# Patient Record
Sex: Male | Born: 1960 | Race: White | Hispanic: No | Marital: Married | Smoking: Former smoker
Health system: Southern US, Community
[De-identification: ages and names within clinical notes are randomized; demographics above are authoritative.]

## PROBLEM LIST (undated history)

## (undated) DIAGNOSIS — E119 Type 2 diabetes mellitus without complications: Secondary | ICD-10-CM

## (undated) HISTORY — PX: OTHER SURGICAL HISTORY: SHX169

## (undated) HISTORY — DX: Type 2 diabetes mellitus without complications: E11.9

---

## 2009-05-31 ENCOUNTER — Encounter: Admission: RE | Admit: 2009-05-31 | Discharge: 2009-05-31 | Payer: Self-pay | Admitting: Orthopedic Surgery

## 2009-07-04 ENCOUNTER — Encounter: Admission: RE | Admit: 2009-07-04 | Discharge: 2009-08-29 | Payer: Self-pay | Admitting: Orthopedic Surgery

## 2010-07-24 LAB — HM COLONOSCOPY

## 2011-01-16 ENCOUNTER — Ambulatory Visit (INDEPENDENT_AMBULATORY_CARE_PROVIDER_SITE_OTHER): Payer: 59 | Admitting: Emergency Medicine

## 2011-01-16 ENCOUNTER — Encounter: Payer: Self-pay | Admitting: Emergency Medicine

## 2011-01-16 DIAGNOSIS — J069 Acute upper respiratory infection, unspecified: Secondary | ICD-10-CM

## 2011-01-16 DIAGNOSIS — E119 Type 2 diabetes mellitus without complications: Secondary | ICD-10-CM

## 2011-01-16 LAB — CONVERTED CEMR LAB: Blood Glucose, Fingerstick: 136

## 2011-01-19 NOTE — Assessment & Plan Note (Signed)
Summary: SORE THROAT, CONGESTION, COUGH/WSE   Vital Signs:  Patient Profile:   50 Years Old Male CC:      dry cough, runny nose, congestion x 2 weeks Height:     70 inches Weight:      228 pounds O2 Sat:      97 % O2 treatment:    Room Air Temp:     98.6 degrees F oral Pulse rate:   80 / minute Resp:     16 per minute BP sitting:   122 / 76  (left arm) Cuff size:   large  Vitals Entered By: Betti Cruz RN (January 16, 2011 2:46 PM)             Is Patient Diabetic? Yes  CBG Result 136      Updated Prior Medication List: No Medications Current Allergies: No known allergies History of Present Illness Chief Complaint: dry cough, runny nose, congestion x 2 weeks History of Present Illness: 50 Years Old Male complains of onset of cold symptoms fora few weeks.  Asa has been using Mucinex which is helping a little bit. No sore throat + cough No pleuritic pain No wheezing + nasal congestion + post-nasal drainage + sinus pain/pressure No chest congestion No itchy/red eyes No earache No hemoptysis No SOB No chills/sweats No fever No nausea No vomiting No abdominal pain No diarrhea No skin rashes No fatigue No myalgias No headache   REVIEW OF SYSTEMS Constitutional Symptoms      Denies fever, chills, night sweats, weight loss, weight gain, and fatigue.  Eyes       Denies change in vision, eye pain, eye discharge, glasses, contact lenses, and eye surgery. Ear/Nose/Throat/Mouth       Denies hearing loss/aids, change in hearing, ear pain, ear discharge, dizziness, frequent runny nose, frequent nose bleeds, sinus problems, sore throat, hoarseness, and tooth pain or bleeding.      Comments: congestion Respiratory       Complains of dry cough.      Denies productive cough, wheezing, shortness of breath, asthma, bronchitis, and emphysema/COPD.  Cardiovascular       Denies murmurs, chest pain, and tires easily with exhertion.    Gastrointestinal       Denies  stomach pain, nausea/vomiting, diarrhea, constipation, blood in bowel movements, and indigestion. Genitourniary       Denies painful urination, blood or discharge from penis, kidney stones, and loss of urinary control. Neurological       Complains of headaches.      Denies paralysis, seizures, and fainting/blackouts. Musculoskeletal       Denies muscle pain, joint pain, joint stiffness, decreased range of motion, redness, swelling, muscle weakness, and gout.  Skin       Denies bruising, unusual mles/lumps or sores, and hair/skin or nail changes.  Psych       Denies mood changes, temper/anger issues, anxiety/stress, speech problems, depression, and sleep problems. Other Comments: Taken Mucinex OTC. Cough x 2 months, congestion x 2 weeks   Past History:  Past Medical History: Diabetes mellitus, type II  Past Surgical History: rigth shoulder right arm  Family History: none  Social History: Never Smoked Alcohol use-no Drug use-no Smoking Status:  never Drug Use:  no Physical Exam General appearance: well developed, well nourished, no acute distress Ears: normal, no lesions or deformities Nasal: mucosa pink, nonedematous, no septal deviation, turbinates normal Oral/Pharynx: tongue normal, posterior pharynx without erythema or exudate Chest/Lungs: no rales, wheezes, or rhonchi bilateral,  breath sounds equal without effort Heart: regular rate and  rhythm, no murmur MSE: oriented to time, place, and person Assessment New Problems: UPPER RESPIRATORY INFECTION, ACUTE (ICD-465.9) DIABETES MELLITUS, TYPE II (ICD-250.00)   Plan New Medications/Changes: AMOXICILLIN 875 MG TABS (AMOXICILLIN) 1 by mouth two times a day for 7 days  #14 x 0, 01/16/2011, Fidela Salisbury MD  New Orders: New Patient Level III [phone] Pulse Oximetry (single measurment) [94760] Capillary Blood Glucose/CBG [44739] Planning Comments:   1)  Take the prescribed antibiotic as instructed. 2)  Use nasal  saline solution (over the counter) at least 3 times a day. 3)  Use over the counter decongestants like Zyrtec-D every 12 hours as needed to help with congestion. 4)  Can take tylenol every 6 hours or motrin every 8 hours for pain or fever. 5)  Follow up with your primary doctor  if no improvement in 5-7 days, sooner if increasing pain, fever, or new symptoms.    The patient and/or caregiver has been counseled thoroughly with regard to medications prescribed including dosage, schedule, interactions, rationale for use, and possible side effects and they verbalize understanding.  Diagnoses and expected course of recovery discussed and will return if not improved as expected or if the condition worsens. Patient and/or caregiver verbalized understanding.  Prescriptions: AMOXICILLIN 875 MG TABS (AMOXICILLIN) 1 by mouth two times a day for 7 days  #14 x 0   Entered and Authorized by:   Fidela Salisbury MD   Signed by:   Fidela Salisbury MD on 01/16/2011   Method used:   Print then Give to Patient   RxID:   [id number]   Orders Added: 1)  New Patient Level III [25500] 2)  Pulse Oximetry (single measurment) [94760] 3)  Capillary Blood Glucose/CBG [16429]

## 2013-08-24 ENCOUNTER — Ambulatory Visit (INDEPENDENT_AMBULATORY_CARE_PROVIDER_SITE_OTHER): Payer: 59 | Admitting: Family Medicine

## 2013-08-24 ENCOUNTER — Encounter: Payer: Self-pay | Admitting: Family Medicine

## 2013-08-24 VITALS — BP 125/79 | HR 76 | Ht 70.0 in | Wt 196.0 lb

## 2013-08-24 DIAGNOSIS — Z23 Encounter for immunization: Secondary | ICD-10-CM

## 2013-08-24 DIAGNOSIS — Z0000 Encounter for general adult medical examination without abnormal findings: Secondary | ICD-10-CM

## 2013-08-24 DIAGNOSIS — E1129 Type 2 diabetes mellitus with other diabetic kidney complication: Secondary | ICD-10-CM | POA: Insufficient documentation

## 2013-08-24 DIAGNOSIS — E119 Type 2 diabetes mellitus without complications: Secondary | ICD-10-CM

## 2013-08-24 DIAGNOSIS — Z13220 Encounter for screening for lipoid disorders: Secondary | ICD-10-CM

## 2013-08-24 DIAGNOSIS — Z8042 Family history of malignant neoplasm of prostate: Secondary | ICD-10-CM

## 2013-08-24 DIAGNOSIS — G4700 Insomnia, unspecified: Secondary | ICD-10-CM | POA: Insufficient documentation

## 2013-08-24 DIAGNOSIS — E1165 Type 2 diabetes mellitus with hyperglycemia: Secondary | ICD-10-CM | POA: Insufficient documentation

## 2013-08-24 DIAGNOSIS — Z8669 Personal history of other diseases of the nervous system and sense organs: Secondary | ICD-10-CM

## 2013-08-24 MED ORDER — ZOLPIDEM TARTRATE 5 MG PO TABS: 5.0000 mg | ORAL_TABLET | Freq: Every evening | ORAL | Status: DC | PRN

## 2013-08-24 NOTE — Progress Notes (Signed)
CC: Cristian Watkins is a 52 y.o. male is here for Establish Care   Subjective: HPI:  Colonoscopy: Performed in 2011 given five-year clearance awaiting outside records Prostate: Discussed screening risks/beneifts with patient on 08/24/2013 given history of prostate cancer in father obtain PSA   Influenza Vaccine: He will receive today Pneumovax: Indicated due to type 2 diabetes, he's unsure whether or not he got this at his former practice we will await records Td/Tdap: He believes it's been less than 10 years since last booster we will await records for confirmation Zoster: (Start 52 yo)  Over the past 2 weeks have you been bothered by: - Little interest or pleasure in doing things: No - Feeling down depressed or hopeless: No  Patient's only complaint today is trouble sleeping. He reports after 6 hours of sleep he wakes feeling well rested but after getting home from work will feel intense need for napping. He goes to sleep same hour every night. He denies waking from discomfort anxiety or restlessness he has been given a diagnosis of sleep apnea 15 years ago but refuses to wear CPAP.   Works out on a daily basis rare alcohol use, no recreational drug use or tobacco use   Review of Systems - General ROS: negative for - chills, fever, night sweats, weight gain or weight loss Ophthalmic ROS: negative for - decreased vision Psychological ROS: negative for - anxiety or depression ENT ROS: negative for - hearing change, nasal congestion, tinnitus or allergies Hematological and Lymphatic ROS: negative for - bleeding problems, bruising or swollen lymph nodes Breast ROS: negative Respiratory ROS: no cough, shortness of breath, or wheezing Cardiovascular ROS: no chest pain or dyspnea on exertion Gastrointestinal ROS: no abdominal pain, change in bowel habits, or black or bloody stools Genito-Urinary ROS: negative for - genital discharge, genital ulcers, incontinence or abnormal bleeding from  genitals Musculoskeletal ROS: negative for - joint pain or muscle pain Neurological ROS: negative for - headaches or memory loss Dermatological ROS: negative for lumps, mole changes, rash and skin lesion changes  Past Medical History  Diagnosis Date  . Diabetes      Family History  Problem Relation Age of Onset  . Prostate cancer Father      History  Substance Use Topics  . Smoking status: Never Smoker   . Smokeless tobacco: Not on file  . Alcohol Use: Yes     Objective: Filed Vitals:   08/24/13 1116  BP: 125/79  Pulse: 76   General: No Acute Distress HEENT: Atraumatic, normocephalic, conjunctivae normal without scleral icterus.  No nasal discharge, hearing grossly intact, TMs with good landmarks bilaterally with no middle ear abnormalities, posterior pharynx clear without oral lesions. Neck: Supple, trachea midline, no cervical nor supraclavicular adenopathy. Pulmonary: Clear to auscultation bilaterally without wheezing, rhonchi, nor rales. Cardiac: Regular rate and rhythm.  No murmurs, rubs, nor gallops. No peripheral edema.  2+ peripheral pulses bilaterally. Abdomen: Bowel sounds normal.  No masses.  Non-tender without rebound.  Negative Murphy's sign. GU: Penis without lesions.  Bilateral descended non-tender testicles without palpable abnormal masses. Small epidermoid cyst noninflamed right scrotum MSK: Grossly intact, no signs of weakness.  Full strength throughout upper and lower extremities.  Full ROM in upper and lower extremities.  No midline spinal tenderness. Neuro: Gait unremarkable, CN II-XII grossly intact.  C5-C6 Reflex 2/4 Bilaterally, L4 Reflex 2/4 Bilaterally.  Cerebellar function intact. Skin: No rashes. Psych: Alert and oriented to person/place/time.  Thought process normal. No anxiety/depression.  Assessment &  Plan: Knolan was seen today for establish care.  Diagnoses and associated orders for this visit:  Annual physical exam  Family history of  prostate cancer - PSA  Insomnia - zolpidem (AMBIEN) 5 MG tablet; Take 1 tablet (5 mg total) by mouth at bedtime as needed for sleep.  Lipid screening - Lipid panel  Type 2 diabetes mellitus - BASIC METABOLIC PANEL WITH GFR - Hemoglobin A1c  Need for prophylactic vaccination and inoculation against influenza  History of sleep apnea    Healthy lifestyle interventions including but limited to regular exercise, a healthy low fat diet, moderation of salt intake, the dangers of tobacco/alcohol/recreational drug use, nutrition supplementation, and accident avoidance were discussed with the patient and a handout was provided for future reference. Checking A1c for history of type 2 diabetes diet controlled Trial of Ambien to help with longer sleep to see if this helps with insomnia and improving fatigue at the end of the day  Return in about 3 months (around 11/24/2013).

## 2013-08-24 NOTE — Patient Instructions (Addendum)
Dr. Lajoyce Lauber General Advice Following Your Complete Physical Exam  The Benefits of Regular Exercise: Unless you suffer from an uncontrolled cardiovascular condition, studies strongly suggest that regular exercise and physical activity will add to both the quality and length of your life.  The World Health Organization recommends 150 minutes of moderate intensity aerobic activity every week.  This is best split over 3-4 days a week, and can be as simple as a brisk walk for just over 35 minutes "most days of the week".  This type of exercise has been shown to lower LDL-Cholesterol, lower average blood sugars, lower blood pressure, lower cardiovascular disease risk, improve memory, and increase one's overall sense of wellbeing.  The addition of anaerobic (or "strength training") exercises offers additional benefits including but not limited to increased metabolism, prevention of osteoporosis, and improved overall cholesterol levels.  How Can I Strive For A Low-Fat Diet?: Current guidelines recommend that 25-35 percent of your daily energy (food) intake should come from fats.  One might ask how can this be achieved without having to dissect each meal on a daily basis?  Switch to skim or 1% milk instead of whole milk.  Focus on lean meats such as ground [place], fresh fish, baked chicken, and lean cuts of beef as your source of dietary protein.  Consume less than 367m/day of dietary cholesterol.  Limit trans fatty acid consumption primarily by limiting synthetic trans fats such as partially hydrogenated oils (Ex: fried fast foods).  Focus efforts on reducing your intake of "solid" fats (Ex: Butter).  Substitute olive or vegetable oil for solid fats where possible.  Moderation of Salt Intake: Provided you don't carry a diagnosis of congestive heart failure nor renal failure, I recommend a daily allowance of no more than 2300 mg of salt (sodium).  Keeping under this daily goal is associated with a  decreased risk of cardiovascular events, creeping above it can lead to elevated blood pressures and increases your risk of cardiovascular events.  Milligrams (mg) of salt is listed on all nutrition labels, and your daily intake can add up faster than you think.  Most canned and frozen dinners can pack in over half your daily salt allowance in one meal.    Lifestyle Health Risks: Certain lifestyle choices carry specific health risks.  As you may already know, tobacco use has been associated with increasing one's risk of cardiovascular disease, pulmonary disease, numerous cancers, among many other issues.  What you may not know is that there are medications and nicotine replacement strategies that can more than double your chances of successfully quitting.  I would be thrilled to help manage your quitting strategy if you currently use tobacco products.  When it comes to alcohol use, I've yet to find an "ideal" daily allowance.  Provided an individual does not have a medical condition that is exacerbated by alcohol consumption, general guidelines determine "safe drinking" as no more than two standard drinks for a man or no more than one standard drink for a male per day.  However, much debate still exists on whether any amount of alcohol consumption is technically "safe".  My general advice, keep alcohol consumption to a minimum for general health promotion.  If you or others believe that alcohol, tobacco, or recreational drug use is interfering with your life, I would be happy to provide confidential counseling regarding treatment options.  General "Over The Counter" Nutrition Advice: Postmenopausal women should aim for a daily calcium intake of 1200 mg, however a significant  portion of this might already be provided by diets including milk, yogurt, cheese, and other dairy products.  Vitamin D has been shown to help preserve bone density, prevent fatigue, and has even been shown to help reduce falls in the  elderly.  Ensuring a daily intake of 800 Units of Vitamin D is a good place to start to enjoy the above benefits, we can easily check your Vitamin D level to see if you'd potentially benefit from supplementation beyond 800 Units a day.  Folic Acid intake should be of particular concern to women of childbearing age.  Daily consumption of 824-235 mcg of Folic Acid is recommended to minimize the chance of spinal cord defects in a fetus should pregnancy occur.    For many adults, accidents still remain one of the most common culprits when it comes to cause of death.  Some of the simplest but most effective preventitive habits you can adopt include regular seatbelt use, proper helmet use, securing firearms, and regularly testing your smoke and carbon monoxide detectors.  Carlen Fils [address], [place], [place] [address] Phone: [phone]   Self-Exam Of The Dionne Milo men ages 65-35 are the ones who most commonly get cancer of the testicles. About 42 young men die of this disease each year. Testicular cancer does occur in middle-aged or older men but to a lesser extent. This cancer can almost always be cured if it is found before it gets bad and if it is treated. WORDS TO KNOW:  Testicles are the 2 egg-shaped glands that make hormones and sperm in men.  The scrotum is the skin around testicles.  Epididymis is the rope-like part that is behind and above each testis. It collects sperm made by the testis. WHICH MEN ARE AT HIGH RISK FOR CANCER OF THE TESTICLES?  Men between ages 110 to 45.  Men who are Caucasian.  Men who were born with a testicle that had not moved down into the scrotum.  Men whose testicles have gotten smaller because of an infection.  Men whose fathers or brothers have had cancer of the testicles. SYMPTOMS OF TESTICULAR CANCER  A painless swelling in one of your testicles.  A hard lump. Some lumps may be an infection.  A  heavy feeling in your testicles.  An ache in your lower belly (abdomen) or groin. HOW OFTEN SHOULD I CHECK MY TESTICLES? You should check your testicles every month. HOW SHOULD I DO THIS CHECK? It is best to check your testicles right after a warm shower or bath.  Look at your testicles for any swelling. You may need to use a mirror.  Use both your hands to roll each testicle between your thumb and fingers. Feel for any lumps or changes in the size of the testicle. Press firmly. You may find that one testicle is a little bigger than the other. This is normal.  Next, check the epididymis on each testicle. This is the part where most testicular cancers happen. It is normal for the epididymis to feel soft and uneven. When you get used to how your epididymis feels, you will be able to tell if there is any change.  WHAT IF I FIND ANY SWELLINGS OR LUMPS?  Call your doctor. Some lumps may be an infection. If the lump or swelling is a cancer, it can be treated before it gets worse.  Document Released: 02/04/2009 Document Revised: 01/31/2012 Document Reviewed: 02/04/2009 [employer] Patient Information 2014  [employer]

## 2013-08-31 LAB — BASIC METABOLIC PANEL WITH GFR
CO2: 29 mEq/L (ref 19–32)
Chloride: 106 mEq/L (ref 96–112)
Creat: 0.76 mg/dL (ref 0.50–1.35)
Glucose, Bld: 120 mg/dL — ABNORMAL HIGH (ref 70–99)
Sodium: 138 mEq/L (ref 135–145)

## 2013-08-31 LAB — HEMOGLOBIN A1C
Hgb A1c MFr Bld: 6.2 % — ABNORMAL HIGH (ref <5.7)
Mean Plasma Glucose: 131 mg/dL — ABNORMAL HIGH (ref <117)

## 2013-08-31 LAB — LIPID PANEL
LDL Cholesterol: 69 mg/dL (ref 0–99)
Triglycerides: 34 mg/dL (ref <150)

## 2013-12-18 ENCOUNTER — Encounter: Payer: Self-pay | Admitting: Family Medicine

## 2013-12-18 ENCOUNTER — Ambulatory Visit (INDEPENDENT_AMBULATORY_CARE_PROVIDER_SITE_OTHER): Payer: 59 | Admitting: Family Medicine

## 2013-12-18 VITALS — BP 124/77 | HR 76 | Wt 192.0 lb

## 2013-12-18 DIAGNOSIS — M25519 Pain in unspecified shoulder: Secondary | ICD-10-CM

## 2013-12-18 DIAGNOSIS — M25512 Pain in left shoulder: Secondary | ICD-10-CM

## 2013-12-18 MED ORDER — MELOXICAM 15 MG PO TABS: 15.0000 mg | ORAL_TABLET | Freq: Every day | ORAL | Status: DC

## 2013-12-18 NOTE — Progress Notes (Signed)
CC: Cristian Watkins is a 53 y.o. male is here for left arm injury   Subjective: HPI:  Patient complains of left arm/shoulder pain that has been present for one month on a daily basis. Pain came on about 1 day after he fell after tripping over a curb and braced his fall with his left outstretched hand. Pain is described as a moderate pain that is nonradiating. It is slightly getting better however 2 weeks ago after playing racquetball symptoms return to a moderate degree and have been persistent since. No interventions as of yet. Symptoms are worse with rolling over his left shoulder while sleeping, abducting the arm or flexing the arm beyond 60. Nothing else seems to make it better or worse. He denies any swelling redness or warmth in the shoulder or the arm. Denies weakness, motor or sensory disturbances in the left upper extremity. Pain is localized to the anterior shoulder radiating slightly to the proximal bicep. Denies fevers, chills, shortness of breath, chest pain, nor overlying skin changes at the site of pain   Review Of Systems Outlined In HPI  Past Medical History  Diagnosis Date  . Diabetes      Family History  Problem Relation Age of Onset  . Prostate cancer Father      History  Substance Use Topics  . Smoking status: Never Smoker   . Smokeless tobacco: Not on file  . Alcohol Use: Yes     Objective: Filed Vitals:   12/18/13 1355  BP: 124/77  Pulse: 76    General: Alert and Oriented, No Acute Distress HEENT: Pupils equal, round, reactive to light. Conjunctivae clear.  Moist mucous membranes pharynx unremarkable Lungs: Clear to auscultation bilaterally, no wheezing/ronchi/rales.  Comfortable work of breathing. Good air movement. Extremities: No peripheral edema.  Strong peripheral pulses. He has full grip strength and range of motion from the elbows distally in the left upper extremity. Left shoulder exam shows negative speeds, positive Hawkins, positive empty can,  unable to tolerate Neers due to pain, negative crossarm test. Actively he is only able to flex his arm to approximately 60 or abduct arm to 60 without having to stop due to pain. No pain over palpation of a.c. Joint. Subscapularis and infraspinatus testing shows full strength in the left upper extremity. Mental Status: No depression, anxiety, nor agitation. Skin: Warm and dry.  Assessment & Plan: Cristian Watkins was seen today for left arm injury.  Diagnoses and associated orders for this visit:  Left shoulder pain - meloxicam (MOBIC) 15 MG tablet; Take 1 tablet (15 mg total) by mouth daily.     left shoulder pain with suspicion for supraspinatus tear and or tendinitis. I've encouraged him to start a home exercise plan including a light resistance training with an elastic band, handout provided. Start Mobic as needed for pain. If not improving in 2 weeks return for evaluation with other team sports medicine  25 minutes spent face-to-face during visit today of which at least 50% was counseling or coordinating care regarding: 1. Left shoulder pain      Return in about 2 weeks (around 01/01/2014) for Sports medicine referral with Dr. Darene Lamer. if not improving.

## 2014-07-09 ENCOUNTER — Ambulatory Visit (INDEPENDENT_AMBULATORY_CARE_PROVIDER_SITE_OTHER): Payer: 59 | Admitting: Family Medicine

## 2014-07-09 ENCOUNTER — Encounter: Payer: Self-pay | Admitting: Family Medicine

## 2014-07-09 ENCOUNTER — Encounter (INDEPENDENT_AMBULATORY_CARE_PROVIDER_SITE_OTHER): Payer: Self-pay

## 2014-07-09 VITALS — BP 145/90 | HR 82 | Wt 208.0 lb

## 2014-07-09 DIAGNOSIS — M25519 Pain in unspecified shoulder: Secondary | ICD-10-CM

## 2014-07-09 DIAGNOSIS — Z2839 Other underimmunization status: Secondary | ICD-10-CM

## 2014-07-09 DIAGNOSIS — Z23 Encounter for immunization: Secondary | ICD-10-CM

## 2014-07-09 DIAGNOSIS — G4700 Insomnia, unspecified: Secondary | ICD-10-CM

## 2014-07-09 DIAGNOSIS — M25561 Pain in right knee: Secondary | ICD-10-CM

## 2014-07-09 DIAGNOSIS — M25569 Pain in unspecified knee: Secondary | ICD-10-CM

## 2014-07-09 DIAGNOSIS — M25512 Pain in left shoulder: Secondary | ICD-10-CM

## 2014-07-09 DIAGNOSIS — Z283 Underimmunization status: Secondary | ICD-10-CM

## 2014-07-09 MED ORDER — DICLOFENAC SODIUM 50 MG PO TBEC: DELAYED_RELEASE_TABLET | ORAL | Status: DC

## 2014-07-09 MED ORDER — SUVOREXANT 10 MG PO TABS: 1.0000 | ORAL_TABLET | Freq: Every evening | ORAL | Status: DC | PRN

## 2014-07-09 NOTE — Progress Notes (Signed)
CC: Cristian Watkins is a 53 y.o. male is here for right shoulder pain and insominia   Subjective: HPI:  Continues to have left shoulder pain that has been present for the past 8 months now. Occurred within minutes to hours after he fell on an outstretched left hand after tripping over a curb. Pain continues to be described only has pain and soreness, worse with 90 or greater abduction of the arm or when placing the arm behind his back. Not much benefit from meloxicam nor home exercise plan. Denies change in character severity or frequency of pain.  It is moderate and persistent throughout the day.  Complains of right knee pain has been present for the past one to 2 months. Worse when running and descending stairs. No other interventions other than trying meloxicam which did not help much. Denies recent injury, swelling, redness, warmth, catching, locking, nor giving way. Pain is described as near the kneecap.  Complains of continued insomnia described as difficulty staying asleep. He will follow sleep without difficulty at 8:00 every night and will await 4-5 times for reasons he is unsure of. He frequently has non-restorative sleep but denies fatigue or having to sleep or nap during the daytime. Ambien provided him no benefit whatsoever   Review Of Systems Outlined In HPI  Past Medical History  Diagnosis Date  . Diabetes     Past Surgical History  Procedure Laterality Date  . Rotator cuff surgeries  [id number]   Family History  Problem Relation Age of Onset  . Prostate cancer Father     History   Social History  . Marital Status: Married    Spouse Name: N/A    Number of Children: N/A  . Years of Education: N/A   Occupational History  . Not on file.   Social History Main Topics  . Smoking status: Never Smoker   . Smokeless tobacco: Not on file  . Alcohol Use: Yes  . Drug Use: No  . Sexual Activity: Not on file   Other Topics Concern  . Not on file   Social History  Narrative  . No narrative on file     Objective: BP 145/90  Pulse 82  Wt 208 lb (94.348 kg)  General: Alert and Oriented, No Acute Distress HEENT: Pupils equal, round, reactive to light. Conjunctivae clear.  Moist mucous membranes pharynx unremarkable Lungs: Clear to auscultation bilaterally, no wheezing/ronchi/rales.  Comfortable work of breathing. Good air movement. Cardiac: Regular rate and rhythm. Normal S1/S2.  No murmurs, rubs, nor gallops.   Left shoulder exam reveals full range of motion and strength in all planes of motion and with individual rotator cuff testing. No overlying redness warmth or swelling.  Neer's test positive.  Hawkins test positive. Empty can positive. Crossarm test positive. O'Brien's test positive. Apprehension test negative. Speed's test negative. Right knee exam shows full-strength and range of motion. There is no swelling, redness, nor warmth overlying the knee.  No patellar crepitus. Mild patellar apprehension. No pain with palpation of the inferior patellar pole.  No pain or laxity with valgus nor varus stress. Anterior drawer is negative. McMurray's negative. No popliteal space tenderness or palpable mass. No medial or lateral joint line tenderness to palpation. Mental Status: No depression, anxiety, nor agitation. Skin: Warm and dry.  Assessment & Plan: Cristian Watkins was seen today for right shoulder pain and insominia.  Diagnoses and associated orders for this visit:  Insomnia - Suvorexant (BELSOMRA) 10 MG TABS; Take 1 tablet by mouth  at bedtime as needed (insomnia).  Left shoulder pain  Right knee pain - diclofenac (VOLTAREN) 50 MG EC tablet; Take one tablet every 8 hours only as needed for pain, take with small snack.    Insomnia: Uncontrolled, start Belsomra, free 10 dose savings card was provided. If beneficial call for formal prescription. Left shoulder pain: Suspect rotator cuff tendinitis with possible labral tear. Offered corticosteroid  injection which he received today. Discussed that if he does not provide any relief he may need to consider getting an MRI of the shoulder Right knee pain: Suspect patellofemoral syndrome, start diclofenac at home exercise rehabilitation plan on a daily basis which he was provided with today  Return if symptoms worsen or fail to improve.   Subacromial Shoulder Injection Procedure Note  Pre-operative Diagnosis: left shoulder pain  Post-operative Diagnosis: same  Indications: Unexplained monoarthritis and persistent pain  Anesthesia:topical cold spray  Procedure Details   Verbal consent was obtained for the procedure. The shoulder was prepped with alcohol and the skin was anesthetized. A 27 gauge needle was advanced into the subacromial space through posterior approach without difficulty  The space was then injected with 3 ml 1% lidocaine and 2 ml of triamcinolone (KENALOG) 93m/ml. The injection site was cleansed with isopropyl alcohol and a dressing was applied.  Complications:  None; patient tolerated the procedure well.

## 2014-10-25 ENCOUNTER — Other Ambulatory Visit: Payer: Self-pay | Admitting: Family Medicine

## 2014-10-25 ENCOUNTER — Encounter: Payer: Self-pay | Admitting: Family Medicine

## 2014-10-25 ENCOUNTER — Ambulatory Visit (INDEPENDENT_AMBULATORY_CARE_PROVIDER_SITE_OTHER): Payer: 59 | Admitting: Family Medicine

## 2014-10-25 VITALS — BP 127/88 | HR 84 | Temp 98.3°F | Ht 70.0 in | Wt 220.0 lb

## 2014-10-25 DIAGNOSIS — Z23 Encounter for immunization: Secondary | ICD-10-CM

## 2014-10-25 DIAGNOSIS — Z8042 Family history of malignant neoplasm of prostate: Secondary | ICD-10-CM

## 2014-10-25 DIAGNOSIS — E119 Type 2 diabetes mellitus without complications: Secondary | ICD-10-CM

## 2014-10-25 DIAGNOSIS — M25561 Pain in right knee: Secondary | ICD-10-CM

## 2014-10-25 DIAGNOSIS — Z0000 Encounter for general adult medical examination without abnormal findings: Secondary | ICD-10-CM

## 2014-10-25 DIAGNOSIS — M25562 Pain in left knee: Secondary | ICD-10-CM

## 2014-10-25 LAB — CBC WITH DIFFERENTIAL/PLATELET
BASOS PCT: 0 % (ref 0–1)
Basophils Absolute: 0 10*3/uL (ref 0.0–0.1)
EOS ABS: 0.1 10*3/uL (ref 0.0–0.7)
EOS PCT: 2 % (ref 0–5)
HEMATOCRIT: 44.1 % (ref 39.0–52.0)
Hemoglobin: 14.5 g/dL (ref 13.0–17.0)
LYMPHS ABS: 1.4 10*3/uL (ref 0.7–4.0)
Lymphocytes Relative: 30 % (ref 12–46)
MCH: 27.2 pg (ref 26.0–34.0)
MCHC: 32.9 g/dL (ref 30.0–36.0)
MCV: 82.7 fL (ref 78.0–100.0)
MONO ABS: 0.4 10*3/uL (ref 0.1–1.0)
MONOS PCT: 9 % (ref 3–12)
MPV: 10.4 fL (ref 9.4–12.4)
NEUTROS PCT: 59 % (ref 43–77)
Neutro Abs: 2.8 10*3/uL (ref 1.7–7.7)
Platelets: 208 10*3/uL (ref 150–400)
RBC: 5.33 MIL/uL (ref 4.22–5.81)
RDW: 14.3 % (ref 11.5–15.5)
WBC: 4.7 10*3/uL (ref 4.0–10.5)

## 2014-10-25 LAB — COMPREHENSIVE METABOLIC PANEL
ALK PHOS: 48 U/L (ref 39–117)
ALT: 28 U/L (ref 0–53)
AST: 21 U/L (ref 0–37)
Albumin: 4.3 g/dL (ref 3.5–5.2)
BILIRUBIN TOTAL: 0.6 mg/dL (ref 0.2–1.2)
BUN: 11 mg/dL (ref 6–23)
CO2: 27 meq/L (ref 19–32)
CREATININE: 0.79 mg/dL (ref 0.50–1.35)
Calcium: 9.2 mg/dL (ref 8.4–10.5)
Chloride: 106 mEq/L (ref 96–112)
GLUCOSE: 114 mg/dL — AB (ref 70–99)
Potassium: 4.2 mEq/L (ref 3.5–5.3)
Sodium: 141 mEq/L (ref 135–145)
Total Protein: 6.6 g/dL (ref 6.0–8.3)

## 2014-10-25 LAB — CHOLESTEROL, TOTAL: CHOLESTEROL: 153 mg/dL (ref 0–200)

## 2014-10-25 MED ORDER — MELOXICAM 15 MG PO TABS: 15.0000 mg | ORAL_TABLET | Freq: Every day | ORAL | Status: DC

## 2014-10-25 NOTE — Progress Notes (Signed)
CC: Cristian Watkins is a 53 y.o. male is here for Annual Exam   Subjective: HPI:  Colonoscopy: Normal in 2011, repeat 2021 Prostate: Discussed screening risks/beneifts with patient today, he would prefer a PSA.   Influenza Vaccine: will receive today Pneumovax: no current indication Td/Tdap: UTD since this year Zoster: (Start 54 yo)  Presents for complete physical exam with his only complaint being what he describes as fatigue in his knees. It's described as difficulty getting from a kneeling to standing position but he denies any muscular weakness or knee instability. He continues to play recreational racquetball most days of the week without any knee pain or swelling.  Rare alcohol use no tobacco or recreational drug use  Review of Systems - General ROS: negative for - chills, fever, night sweats, weight gain or weight loss Ophthalmic ROS: negative for - decreased vision Psychological ROS: negative for - anxiety or depression ENT ROS: negative for - hearing change, nasal congestion, tinnitus or allergies Hematological and Lymphatic ROS: negative for - bleeding problems, bruising or swollen lymph nodes Breast ROS: negative Respiratory ROS: no cough, shortness of breath, or wheezing Cardiovascular ROS: no chest pain or dyspnea on exertion Gastrointestinal ROS: no abdominal pain, change in bowel habits, or black or bloody stools Genito-Urinary ROS: negative for - genital discharge, genital ulcers, incontinence or abnormal bleeding from genitals Musculoskeletal ROS: negative for - joint pain or muscle pain other than that described above Neurological ROS: negative for - headaches or memory loss Dermatological ROS: negative for lumps, mole changes, rash and skin lesion changes  Past Medical History  Diagnosis Date  . Diabetes     Past Surgical History  Procedure Laterality Date  . Rotator cuff surgeries  [id number]   Family History  Problem Relation Age of Onset  . Prostate  cancer Father     History   Social History  . Marital Status: Married    Spouse Name: N/A    Number of Children: N/A  . Years of Education: N/A   Occupational History  . Not on file.   Social History Main Topics  . Smoking status: Never Smoker   . Smokeless tobacco: Not on file  . Alcohol Use: Yes  . Drug Use: No  . Sexual Activity: Not on file   Other Topics Concern  . Not on file   Social History Narrative     Objective: BP 127/88 mmHg  Pulse 84  Temp(Src) 98.3 F (36.8 C)  Ht 5' 10"  (1.778 m)  Wt 220 lb (99.791 kg)  BMI 31.57 kg/m2  General: No Acute Distress HEENT: Atraumatic, normocephalic, conjunctivae normal without scleral icterus.  No nasal discharge, hearing grossly intact, TMs with good landmarks bilaterally with no middle ear abnormalities, posterior pharynx clear without oral lesions. Neck: Supple, trachea midline, no cervical nor supraclavicular adenopathy. Pulmonary: Clear to auscultation bilaterally without wheezing, rhonchi, nor rales. Cardiac: Regular rate and rhythm.  No murmurs, rubs, nor gallops. No peripheral edema.  2+ peripheral pulses bilaterally. Abdomen: Bowel sounds normal.  No masses.  Non-tender without rebound.  Negative Murphy's sign. GU: Bilateral descended testes without inguinal hernia  MSK: Grossly intact, no signs of weakness.  Full strength throughout upper and lower extremities.  Full ROM in upper and lower extremities.  No midline spinal tenderness. Neuro: Gait unremarkable, CN II-XII grossly intact.  C5-C6 Reflex 2/4 Bilaterally, L4 Reflex 2/4 Bilaterally.  Cerebellar function intact. Skin: No rashes. Psych: Alert and oriented to person/place/time.  Thought process normal. No anxiety/depression.  Assessment & Plan: Cristian Watkins was seen today for annual exam.  Diagnoses and associated orders for this visit:  Need for prophylactic vaccination and inoculation against influenza - Flu Vaccine QUAD 36+ mos PF IM (Fluarix Quad  PF)  Bilateral knee pain  Annual physical exam  Family history of prostate cancer  Type 2 diabetes mellitus without complication  Other Orders - meloxicam (MOBIC) 15 MG tablet; Take 1 tablet (15 mg total) by mouth daily.    Healthy lifestyle interventions including but not limited to regular exercise, a healthy low fat diet, moderation of salt intake, the dangers of tobacco/alcohol/recreational drug use, nutrition supplementation, and accident avoidance were discussed with the patient and a handout was provided for future reference.  A PSA, CMP, A1c, lipid panel was ordered. Our computer system was not functional when these orders were placed and instead they were processed by paper.  Begin meloxicam as needed for knee pain/fatigue  Return if symptoms worsen or fail to improve.

## 2014-10-26 LAB — PSA: PSA: 2.4 ng/mL (ref <=4.00)

## 2014-10-26 LAB — HEMOGLOBIN A1C
HEMOGLOBIN A1C: 6.9 % — AB (ref <5.7)
MEAN PLASMA GLUCOSE: 151 mg/dL — AB (ref <117)

## 2014-11-25 ENCOUNTER — Telehealth: Payer: Self-pay | Admitting: Family Medicine

## 2014-11-25 ENCOUNTER — Ambulatory Visit (INDEPENDENT_AMBULATORY_CARE_PROVIDER_SITE_OTHER): Payer: 59 | Admitting: Family Medicine

## 2014-11-25 ENCOUNTER — Encounter: Payer: Self-pay | Admitting: Family Medicine

## 2014-11-25 VITALS — BP 133/82 | HR 87 | Wt 219.0 lb

## 2014-11-25 DIAGNOSIS — M722 Plantar fascial fibromatosis: Secondary | ICD-10-CM

## 2014-11-25 NOTE — Progress Notes (Signed)
CC: Cristian Watkins is a 54 y.o. male is here for plantar fascitis   Subjective: HPI:  Right heel pain present for the last week, worst first thing in the morning but present with any weightbearing.  Localized to bottom of the heel and non-radiating.  Worse while wearing dress shoes. Symptoms began after he began a new running regimen which is now stopped. Denies any overlying skin changes. No interventions as of yet other than wearing new insoles which doesn't help much, also has tried wearing athletic shoes which helps improve the pain while standing. Denies joint pain elsewhere. Denies any recent trauma. Denies any motor or sensory disturbances in the right lower extremity other than that described above   Review Of Systems Outlined In HPI  Past Medical History  Diagnosis Date  . Diabetes     Past Surgical History  Procedure Laterality Date  . Rotator cuff surgeries  [id number]   Family History  Problem Relation Age of Onset  . Prostate cancer Father     History   Social History  . Marital Status: Married    Spouse Name: N/A    Number of Children: N/A  . Years of Education: N/A   Occupational History  . Not on file.   Social History Main Topics  . Smoking status: Never Smoker   . Smokeless tobacco: Not on file  . Alcohol Use: Yes  . Drug Use: No  . Sexual Activity: Not on file   Other Topics Concern  . Not on file   Social History Narrative     Objective: BP 133/82 mmHg  Pulse 87  Wt 219 lb (99.338 kg)  Vital signs reviewed. General: Alert and Oriented, No Acute Distress HEENT: Pupils equal, round, reactive to light. Conjunctivae clear.  External ears unremarkable.  Moist mucous membranes. Lungs: Clear and comfortable work of breathing, speaking in full sentences without accessory muscle use. Cardiac: Regular rate and rhythm.  Neuro: CN II-XII grossly intact, gait normal. Extremities: No peripheral edema.  Strong peripheral pulses. Right foot exam reveals  no pain at the lateral or medial malleoli, no pain at the base of the fifth metatarsal, no pain at the navicular, no pain with resisted inversion or eversion, pain is reproduced with passive dorsiflexion or pressing on the bottom of the calcaneus Mental Status: No depression, anxiety, nor agitation. Logical though process. Skin: Warm and dry.  Assessment & Plan: Odies was seen today for plantar fascitis.  Diagnoses and associated orders for this visit:  Plantar fasciitis of right foot    Plantar fasciitis: Discussed home rehabilitative exercises and stretches to be performed on a daily basis for the next 3-4 weeks before returning to his running regimen. Offered meloxicam however he politely declined, asked to call me if he reconsiders. Discussed wearing heel cups and avoiding dress shoes, provided with a letter stating that it is medically necessary for him to wear shoes that are not dress shoes.    Return if symptoms worsen or fail to improve.

## 2014-11-25 NOTE — Telephone Encounter (Signed)
Patient states that due to his plantar fascitis he needs work note stating that he can wear tennis shoes instead of dress shoes.  Please let him know when this is ready.  thanks

## 2014-11-25 NOTE — Telephone Encounter (Signed)
Pt has not been seen for plantar fascitis and I didn't see that this was noted anywhere in his chart and I don't see an diagnosis for this. He states he thought he received some exercises about this but all I saw that was addressed was  his knee pain and shoulder pain  and exercises were given for both of these. Offered pt appt and he did accept

## 2015-10-31 ENCOUNTER — Ambulatory Visit (INDEPENDENT_AMBULATORY_CARE_PROVIDER_SITE_OTHER): Payer: 59 | Admitting: Family Medicine

## 2015-10-31 ENCOUNTER — Ambulatory Visit (INDEPENDENT_AMBULATORY_CARE_PROVIDER_SITE_OTHER): Payer: 59

## 2015-10-31 ENCOUNTER — Encounter: Payer: 59 | Admitting: Family Medicine

## 2015-10-31 ENCOUNTER — Encounter: Payer: Self-pay | Admitting: Family Medicine

## 2015-10-31 VITALS — BP 130/85 | HR 75 | Wt 221.0 lb

## 2015-10-31 DIAGNOSIS — E119 Type 2 diabetes mellitus without complications: Secondary | ICD-10-CM

## 2015-10-31 DIAGNOSIS — G8929 Other chronic pain: Secondary | ICD-10-CM

## 2015-10-31 DIAGNOSIS — Z0000 Encounter for general adult medical examination without abnormal findings: Secondary | ICD-10-CM | POA: Diagnosis not present

## 2015-10-31 DIAGNOSIS — M222X2 Patellofemoral disorders, left knee: Secondary | ICD-10-CM

## 2015-10-31 DIAGNOSIS — G4700 Insomnia, unspecified: Secondary | ICD-10-CM

## 2015-10-31 DIAGNOSIS — M170 Bilateral primary osteoarthritis of knee: Secondary | ICD-10-CM | POA: Diagnosis not present

## 2015-10-31 DIAGNOSIS — Z23 Encounter for immunization: Secondary | ICD-10-CM | POA: Diagnosis not present

## 2015-10-31 DIAGNOSIS — M25569 Pain in unspecified knee: Secondary | ICD-10-CM

## 2015-10-31 DIAGNOSIS — Z8042 Family history of malignant neoplasm of prostate: Secondary | ICD-10-CM

## 2015-10-31 DIAGNOSIS — M222X1 Patellofemoral disorders, right knee: Secondary | ICD-10-CM | POA: Insufficient documentation

## 2015-10-31 LAB — CBC
HEMATOCRIT: 43.4 % (ref 39.0–52.0)
HEMOGLOBIN: 15 g/dL (ref 13.0–17.0)
MCH: 28 pg (ref 26.0–34.0)
MCHC: 34.6 g/dL (ref 30.0–36.0)
MCV: 81 fL (ref 78.0–100.0)
MPV: 10.3 fL (ref 8.6–12.4)
Platelets: 200 10*3/uL (ref 150–400)
RBC: 5.36 MIL/uL (ref 4.22–5.81)
RDW: 15.1 % (ref 11.5–15.5)
WBC: 4.7 10*3/uL (ref 4.0–10.5)

## 2015-10-31 LAB — COMPLETE METABOLIC PANEL WITH GFR
ALBUMIN: 4.3 g/dL (ref 3.6–5.1)
ALK PHOS: 56 U/L (ref 40–115)
ALT: 35 U/L (ref 9–46)
AST: 23 U/L (ref 10–35)
BUN: 12 mg/dL (ref 7–25)
CALCIUM: 8.8 mg/dL (ref 8.6–10.3)
CHLORIDE: 103 mmol/L (ref 98–110)
CO2: 25 mmol/L (ref 20–31)
Creat: 0.8 mg/dL (ref 0.70–1.33)
Glucose, Bld: 155 mg/dL — ABNORMAL HIGH (ref 65–99)
POTASSIUM: 4.2 mmol/L (ref 3.5–5.3)
Sodium: 138 mmol/L (ref 135–146)
Total Bilirubin: 0.5 mg/dL (ref 0.2–1.2)
Total Protein: 6.8 g/dL (ref 6.1–8.1)

## 2015-10-31 LAB — LIPID PANEL
CHOL/HDL RATIO: 2.6 ratio (ref <=5.0)
CHOLESTEROL: 143 mg/dL (ref 125–200)
HDL: 54 mg/dL (ref >=40)
LDL Cholesterol: 77 mg/dL (ref <130)
TRIGLYCERIDES: 62 mg/dL (ref <150)
VLDL: 12 mg/dL (ref <30)

## 2015-10-31 LAB — HEMOGLOBIN A1C
HEMOGLOBIN A1C: 8.1 % — AB (ref <5.7)
MEAN PLASMA GLUCOSE: 186 mg/dL — AB (ref <117)

## 2015-10-31 MED ORDER — TRAZODONE HCL 100 MG PO TABS: 100.0000 mg | ORAL_TABLET | Freq: Every day | ORAL | Status: DC

## 2015-10-31 NOTE — Progress Notes (Signed)
CC: Cristian Watkins is a 54 y.o. male is here for Annual Exam   Subjective: HPI:  Colonoscopy: Normal in 2011, repeat 2021 Prostate: Discussed screening risks/beneifts with patient today, he would prefer a PSA.  Influenza Vaccine: Will receive today Pneumovax: no current indication Td/Tdap: UTD since this year Zoster: (Start 55 yo)  Requesting complete physical exam  Continued persistent bilateral knee pain. Absent when playing racquetball or resting. Worse with running or getting up from the floor. Also worse going downstairs. Localized behind the knee caps. Home exercise rehabilitation has not provided any benefit. He denies catching locking giving way or any swelling.  He's also having difficulty staying asleep. He wakes up 4 times a night for reasons unknown to him. He denies anxiety, urinary habits or pain waking him up. No benefit from Ambien or belsomra.  Review of Systems - General ROS: negative for - chills, fever, night sweats, weight gain or weight loss Ophthalmic ROS: negative for - decreased vision Psychological ROS: negative for - anxiety or depression ENT ROS: negative for - hearing change, nasal congestion, tinnitus or allergies Hematological and Lymphatic ROS: negative for - bleeding problems, bruising or swollen lymph nodes Breast ROS: negative Respiratory ROS: no cough, shortness of breath, or wheezing Cardiovascular ROS: no chest pain or dyspnea on exertion Gastrointestinal ROS: no abdominal pain, change in bowel habits, or black or bloody stools Genito-Urinary ROS: negative for - genital discharge, genital ulcers, incontinence or abnormal bleeding from genitals Musculoskeletal ROS: negative for - joint pain or muscle pain other than that described above Neurological ROS: negative for - headaches or memory loss Dermatological ROS: negative for lumps, mole changes, rash and skin lesion changes  Past Medical History  Diagnosis Date  . Diabetes     Past Surgical  History  Procedure Laterality Date  . Rotator cuff surgeries  [id number]   Family History  Problem Relation Age of Onset  . Prostate cancer Father     Social History   Social History  . Marital Status: Married    Spouse Name: N/A  . Number of Children: N/A  . Years of Education: N/A   Occupational History  . Not on file.   Social History Main Topics  . Smoking status: Never Smoker   . Smokeless tobacco: Not on file  . Alcohol Use: Yes  . Drug Use: No  . Sexual Activity: Not on file   Other Topics Concern  . Not on file   Social History Narrative     Objective: BP 130/85 mmHg  Pulse 75  Wt 221 lb (100.245 kg)  General: No Acute Distress HEENT: Atraumatic, normocephalic, conjunctivae normal without scleral icterus.  No nasal discharge, hearing grossly intact, TMs with good landmarks bilaterally with no middle ear abnormalities, posterior pharynx clear without oral lesions. Neck: Supple, trachea midline, no cervical nor supraclavicular adenopathy. Pulmonary: Clear to auscultation bilaterally without wheezing, rhonchi, nor rales. Cardiac: Regular rate and rhythm.  No murmurs, rubs, nor gallops. No peripheral edema.  2+ peripheral pulses bilaterally. Abdomen: Bowel sounds normal.  No masses.  Non-tender without rebound.  Negative Murphy's sign. MSK: Grossly intact, no signs of weakness.  Full strength throughout upper and lower extremities.  Full ROM in upper and lower extremities.  No midline spinal tenderness. Neuro: Gait unremarkable, CN II-XII grossly intact.  C5-C6 Reflex 2/4 Bilaterally, L4 Reflex 2/4 Bilaterally.  Cerebellar function intact. Skin: No rashes. Psych: Alert and oriented to person/place/time.  Thought process normal. No anxiety/depression.  Assessment & Plan:  Cristian Watkins was seen today for annual exam.  Diagnoses and all orders for this visit:  Annual physical exam -     PSA -     Lipid panel -     COMPLETE METABOLIC PANEL WITH GFR -     CBC -      Hemoglobin A1c  Family history of prostate cancer -     PSA  Type 2 diabetes mellitus without complication, without long-term current use of insulin (HCC) -     Hemoglobin A1c  Chronic knee pain, unspecified laterality -     DG Knee Complete 4 Views Right; Future -     DG Knee Complete 4 Views Left; Future  Insomnia -     traZODone (DESYREL) 100 MG tablet; Take 1 tablet (100 mg total) by mouth at bedtime.   Healthy lifestyle interventions including but not limited to regular exercise, a healthy low fat diet, moderation of salt intake, the dangers of tobacco/alcohol/recreational drug use, nutrition supplementation, and accident avoidance were discussed with the patient and a handout was provided for future reference.  He would be interested in pursuing injections it would help his knees, will obtain x-rays for further evaluation first.  Trial of trazodone to help with sleep.  Return if symptoms worsen or fail to improve.

## 2015-11-01 LAB — PSA: PSA: 2.29 ng/mL (ref <=4.00)

## 2015-11-03 ENCOUNTER — Encounter: Payer: Self-pay | Admitting: Family Medicine

## 2015-11-03 ENCOUNTER — Ambulatory Visit (INDEPENDENT_AMBULATORY_CARE_PROVIDER_SITE_OTHER): Payer: 59 | Admitting: Family Medicine

## 2015-11-03 ENCOUNTER — Telehealth: Payer: Self-pay | Admitting: Family Medicine

## 2015-11-03 VITALS — BP 129/82 | HR 80 | Wt 225.0 lb

## 2015-11-03 DIAGNOSIS — M7042 Prepatellar bursitis, left knee: Secondary | ICD-10-CM | POA: Diagnosis not present

## 2015-11-03 DIAGNOSIS — M222X2 Patellofemoral disorders, left knee: Secondary | ICD-10-CM

## 2015-11-03 DIAGNOSIS — M7041 Prepatellar bursitis, right knee: Secondary | ICD-10-CM

## 2015-11-03 DIAGNOSIS — M222X1 Patellofemoral disorders, right knee: Secondary | ICD-10-CM

## 2015-11-03 DIAGNOSIS — M7040 Prepatellar bursitis, unspecified knee: Secondary | ICD-10-CM | POA: Insufficient documentation

## 2015-11-03 MED ORDER — DICLOFENAC SODIUM 1 % TD GEL: 4.0000 g | Freq: Four times a day (QID) | TRANSDERMAL | Status: DC

## 2015-11-03 MED ORDER — METFORMIN HCL 1000 MG PO TABS: ORAL_TABLET | ORAL | Status: DC

## 2015-11-03 NOTE — Telephone Encounter (Signed)
Pt advised.

## 2015-11-03 NOTE — Patient Instructions (Signed)
Thank you for coming in today. Do the exercises we discussed.  1) Side leg raise  2) Straight leg raise.  3) Toe out straight leg raise 4) Box squeeze knee extension.  Use the gel 4x daily for pain.  Avoid deep knee bends or squats.  Try to lose weight.   Patellofemoral Pain Syndrome Patellofemoral pain syndrome is a condition that involves a softening or breakdown of the tissue (cartilage) on the underside of your kneecap (patella). This causes pain in the front of the knee. The condition is also called runner's knee or chondromalacia patella. Patellofemoral pain syndrome is most common in young adults who are active in sports. Your knee is the largest joint in your body. The patella covers the front of your knee and is attached to muscles above and below your knee. The underside of the patella is covered with a smooth type of cartilage (synovium). The smooth surface helps the patella glide easily when you move your knee. Patellofemoral pain syndrome causes swelling in the joint linings and bone surfaces in your knee.  CAUSES  Patellofemoral pain syndrome can be caused by:  Overuse.  Poor alignment of your knee joints.  Weak leg muscles.  A direct blow to your kneecap. RISK FACTORS You may be at risk for patellofemoral pain syndrome if you:  Do a lot of activities that can wear down your kneecap. These include:  Running.  Squatting.  Climbing stairs.  Start a new physical activity or exercise program.  Wear shoes that do not fit well.  Do not have good leg strength.  Are overweight. SIGNS AND SYMPTOMS  Knee pain is the most common symptom of patellofemoral pain syndrome. This may feel like a dull, aching pain underneath your patella, in the front of your knee. There may be a popping or cracking sound when you move your knee. Pain may get worse with:  Exercise.  Climbing stairs.  Running.  Jumping.  Squatting.  Kneeling.  Sitting for a long time.  Moving or  pushing on your patella. DIAGNOSIS  Your health care provider may be able to diagnose patellofemoral pain syndrome from your symptoms and medical history. You may be asked about your recent physical activities and which ones cause knee pain. Your health care provider may do a physical exam with certain tests to confirm the diagnosis. These may include:  Moving your patella back and forth.  Checking your range of knee motion.  Having you squat or jump to see if you have pain.  Checking the strength of your leg muscles. An MRI of the knee may also be done. TREATMENT  Patellofemoral pain syndrome can usually be treated at home with rest, ice, compression, and elevation (RICE). Other treatments may include:  Nonsteroidal anti-inflammatory drugs (NSAIDs).  Physical therapy to stretch and strengthen your leg muscles.  Shoe inserts (orthotics) to take stress off your knee.  A knee brace or knee support.  Surgery to remove damaged cartilage or move the patella to a better position. The need for surgery is rare. HOME CARE INSTRUCTIONS   Take medicines only as directed by your health care provider.  Rest your knee.  When resting, keep your knee raised above the level of your heart.  Avoid activities that cause knee pain.  Apply ice to the injured area:  Put ice in a plastic bag.  Place a towel between your skin and the bag.  Leave the ice on for 20 minutes, 2-3 times a day.  Use splints,  braces, knee supports, or walking aids as directed by your health care provider.  Perform stretching and strengthening exercises as directed by your health care provider or physical therapist.  Keep all follow-up visits as directed by your health care provider. This is important. SEEK MEDICAL CARE IF:   Your symptoms get worse.  You are not improving with home care. MAKE SURE YOU:  Understand these instructions.  Will watch your condition.  Will get help right away if you are not doing  well or get worse.   This information is not intended to replace advice given to you by your health care provider. Make sure you discuss any questions you have with your health care provider.   Document Released: 10/27/2009 Document Revised: 11/29/2014 Document Reviewed: 01/28/2014 Elsevier Interactive Patient Education 2016 [place] Bursitis With Rehab  Bursitis is a condition that is characterized by inflammation of a bursa. Saunders Revel exists in many areas of the body. They are fluid-filled sacs that lie between a soft tissue (skin, tendon, or ligament) and a bone, and they reduce friction between the structures as well as the stress placed on the soft tissue. Prepatellar bursitis is inflammation of the bursa that lies between the skin and the kneecap (patella). This condition often causes pain over the patella. SYMPTOMS   Pain, tenderness, and/or inflammation over the patella.  Pain that worsens with movement of the knee joint.  Decreased range of motion for the knee joint.  A crackling sound (crepitation) when the bursa is moved or touched.  Occasionally, painless swelling of the bursa.  Fever (when infected). CAUSES  Bursitis is caused by damage to the bursa, which results in an inflammatory response. Common mechanisms of injury include:  Direct trauma to the front of the knee.  Repetitive and/or stressful use of the knee. RISK INCREASES WITH:  Activities in which kneeling and/or falling on one's knees is likely (volleyball or football).  Repetitive and stressful training, especially if it involves running on hills.  Improper training techniques, such as a sudden increase in the intensity, frequency, or duration of training.  Failure to warm up properly before activity.  Poor technique.  Artificial turf. PREVENTION   Avoid kneeling or falling on your knees.  Warm up and stretch properly before activity.  Allow for adequate recovery between  workouts.  Maintain physical fitness:  Strength, flexibility, and endurance.  Cardiovascular fitness.  Learn and use proper technique. When possible, have a coach correct improper technique.  Wear properly fitted and padded protective equipment (knee pads). PROGNOSIS  If treated properly, then the symptoms of prepatellar bursitis usually resolve within 2 weeks. RELATED COMPLICATIONS   Recurrent symptoms that result in a chronic problem.  Prolonged healing time, if improperly treated or reinjured.  Limited range of motion.  Infection of bursa.  Chronic inflammation or scarring of bursa. TREATMENT  Treatment initially involves the use of ice and medication to help reduce pain and inflammation. The use of strengthening and stretching exercises may help reduce pain with activity, especially those of the quadriceps and hamstring muscles. These exercises may be performed at home or with referral to a therapist. Your caregiver may recommend knee pads when you return to playing sports, in order to reduce the stress on the prepatellar bursa. If symptoms persist despite treatment, then your caregiver may drain fluid out with a needle (aspirate) the bursa. If symptoms persist for greater than 6 months despite nonsurgical (conservative) treatment, then surgery may be recommended to remove  the bursa.  MEDICATION  If pain medication is necessary, then nonsteroidal anti-inflammatory medications, such as aspirin and ibuprofen, or other minor pain relievers, such as acetaminophen, are often recommended.  Do not take pain medication for 7 days before surgery.  Prescription pain relievers may be given if deemed necessary by your caregiver. Use only as directed and only as much as you need.  Corticosteroid injections may be given by your caregiver. These injections should be reserved for the most serious cases, because they may only be given a certain number of times. HEAT AND COLD  Cold treatment  (icing) relieves pain and reduces inflammation. Cold treatment should be applied for 10 to 15 minutes every 2 to 3 hours for inflammation and pain and immediately after any activity that aggravates your symptoms. Use ice packs or massage the area with a piece of ice (ice massage).  Heat treatment may be used prior to performing the stretching and strengthening activities prescribed by your caregiver, physical therapist, or athletic trainer. Use a heat pack or soak the injury in warm water. SEEK MEDICAL CARE IF:  Treatment seems to offer no benefit, or the condition worsens.  Any medications produce adverse side effects. EXERCISES is exercise __________ times per day.  STRENGTHENING EXERCISES - Prepatellar Bursitis  These exercises may help you when beginning to rehabilitate your injury. They may resolve your symptoms with or without further involvement from your physician, physical therapist or athletic trainer. While completing these exercises, remember:  Muscles can gain both the endurance and the strength needed for everyday activities through controlled exercises.  Complete these exercises as instructed by your physician, physical therapist or athletic trainer. Progress the resistance and repetitions only as guided. STRENGTH - Quadriceps, Isometrics  Lie on your back with your right / left leg extended and your opposite knee bent.  Gradually tense the muscles in the front of your right / left thigh. You should see either your kneecap slide up toward your hip or increased dimpling just above the knee. This motion will push the back of the knee down toward the floor/mat/bed on which you are lying.  Hold the muscle as tight as you can without increasing your pain for __________ seconds.  Relax the muscles slowly and completely in between each repetition. Repeat __________ times. Complete this exercise __________ times per day.  STRENGTH - Quadriceps, Short Arcs   Lie on your back. Place  a __________ inch towel roll under your knee so that the knee slightly bends.  Raise only your lower leg by tightening the muscles in the front of your thigh. Do not allow your thigh to rise.  Hold this position for __________ seconds. Repeat __________ times. Complete this exercise __________ times per day.  OPTIONAL ANKLE WEIGHTS: Begin with ____________________, but DO NOT exceed ____________________. Increase in1 lb/0.5 kg increments.  STRENGTH - Quadriceps, Straight Leg Raises  Quality counts! Watch for signs that the quadriceps muscle is working to insure you are strengthening the correct muscles and not "cheating" by substituting with healthier muscles.  Lay on your back with your right / left leg extended and your opposite knee bent.  Tense the muscles in the front of your right / left thigh. You should see either your kneecap slide up or increased dimpling just above the knee. Your thigh may even quiver.  Tighten these muscles even more and raise your leg 4 to 6 inches off the floor. Hold for __________ seconds.  Keeping these muscles tense, lower your leg.  Relax the muscles slowly and completely in between each repetition. Repeat __________ times. Complete this exercise __________ times per day.  This information is not intended to replace advice given to you by your health care provider. Make sure you discuss any questions you have with your health care provider.   Document Released: 11/08/2005 Document Revised: 07/30/2015 Document Reviewed: 02/20/2009 Elsevier Interactive Patient Education [employer].

## 2015-11-03 NOTE — Telephone Encounter (Signed)
Will you please let patient know that his kidney function, liver function, blood cell counts and the PSA prostate test were all normal.  His 3 month average blood sugar was in the uncontrolled diabetic range and I'd recommend starting a daily regimen of metformin to help control his blood sugar.  A Rx [facility] sent [facility] his [facility].  I'd recommend a follow up appointment in three months.

## 2015-11-03 NOTE — Assessment & Plan Note (Signed)
A component of patient's pain is prepatellar bursitis. Treat with diclofenac gel. Return in 2 months

## 2015-11-03 NOTE — Progress Notes (Signed)
   Subjective:    I'm seeing this patient as a consultation for:  Dr. Ileene Rubens  CC: Bilateral knee pain  HPI: Patient notes bilateral anterior knee pain for the last few years. Pain is worse with climbing stairs and standing from a seated position. He denies any radiating pain weakness or numbness fevers or chills. No injury. No treatment tried yet. He was seen by his PCP recently obtain x-ray showing mild patellofemoral DJD.   Past medical history, Surgical history, Family history not pertinant except as noted below, Social history, Allergies, and medications have been entered into the medical record, reviewed, and no changes needed.   Review of Systems: No headache, visual changes, nausea, vomiting, diarrhea, constipation, dizziness, abdominal pain, skin rash, fevers, chills, night sweats, weight loss, swollen lymph nodes, body aches, joint swelling, muscle aches, chest pain, shortness of breath, mood changes, visual or auditory hallucinations.   Objective:    Filed Vitals:   11/03/15 1348  BP: 129/82  Pulse: 80   General: Well Developed, well nourished, and in no acute distress.  Neuro/Psych: Alert and oriented x3, extra-ocular muscles intact, able to move all 4 extremities, sensation grossly intact. Skin: Warm and dry, no rashes noted.  Respiratory: Not using accessory muscles, speaking in full sentences, trachea midline.  Cardiovascular: Pulses palpable, no extremity edema. Abdomen: Does not appear distended. MSK: Knees bilaterally normal-appearing. No effusion. Palpable squeak palpated overlying bilateral patellar tendons. Range of motion 0-120 with minimal retropatellar crepitations bilaterally. Knee is nontender bilaterally. Negative Lachman's anterior drawer McMurray's testing bilaterally. Negative valgus and varus stress bilaterally. Normal gait. Hips nontender however strength is 4/5 bilateral hip abduction.  X-ray knee bilaterally December 2016 reviewed  No results  found for this or any previous visit (from the past 24 hour(s)). No results found.  Impression and Recommendations:   This case required medical decision making of moderate complexity.

## 2015-11-03 NOTE — Assessment & Plan Note (Signed)
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 DJD is minimal. Treat with hip abductor strengthening and VMO strengthening. Recheck in 2 months.

## 2015-12-16 ENCOUNTER — Emergency Department (INDEPENDENT_AMBULATORY_CARE_PROVIDER_SITE_OTHER): Payer: 59

## 2015-12-16 ENCOUNTER — Emergency Department (INDEPENDENT_AMBULATORY_CARE_PROVIDER_SITE_OTHER)
Admission: EM | Admit: 2015-12-16 | Discharge: 2015-12-16 | Disposition: A | Discharge status: Home / Self Care | Payer: 59 | Source: Home / Self Care | Attending: Family Medicine | Admitting: Family Medicine

## 2015-12-16 ENCOUNTER — Encounter: Payer: Self-pay | Admitting: *Deleted

## 2015-12-16 DIAGNOSIS — S92531A Displaced fracture of distal phalanx of right lesser toe(s), initial encounter for closed fracture: Secondary | ICD-10-CM | POA: Diagnosis not present

## 2015-12-16 DIAGNOSIS — S92911B Unspecified fracture of right toe(s), initial encounter for open fracture: Secondary | ICD-10-CM

## 2015-12-16 DIAGNOSIS — X58XXXA Exposure to other specified factors, initial encounter: Secondary | ICD-10-CM

## 2015-12-16 DIAGNOSIS — S6981XA Other specified injuries of right wrist, hand and finger(s), initial encounter: Secondary | ICD-10-CM

## 2015-12-16 DIAGNOSIS — S97121A Crushing injury of right lesser toe(s), initial encounter: Secondary | ICD-10-CM | POA: Diagnosis not present

## 2015-12-16 MED ORDER — IBUPROFEN 800 MG PO TABS
800.0000 mg | ORAL_TABLET | Freq: Once | ORAL | Status: AC
  Administered 2015-12-16: 800 mg via ORAL

## 2015-12-16 MED ORDER — HYDROCODONE-ACETAMINOPHEN 5-325 MG PO TABS: 1.0000 | ORAL_TABLET | Freq: Four times a day (QID) | ORAL | Status: DC | PRN

## 2015-12-16 MED ORDER — IBUPROFEN 600 MG PO TABS: 600.0000 mg | ORAL_TABLET | Freq: Four times a day (QID) | ORAL | Status: DC | PRN

## 2015-12-16 MED ORDER — DOXYCYCLINE HYCLATE 100 MG PO CAPS: 100.0000 mg | ORAL_CAPSULE | Freq: Two times a day (BID) | ORAL | Status: DC

## 2015-12-16 NOTE — ED Provider Notes (Signed)
CSN: [id number]     Arrival date & time 12/16/15  1652 History   First MD Initiated Contact with Patient 12/16/15 1704     Chief Complaint  Patient presents with  . Toe Injury   (Consider location/radiation/quality/duration/timing/severity/associated sxs/prior Treatment) HPI Pt is a 54yo male presenting to [facility] with c/o Right great toe pain and bleeding secondary to a crush injury that occurred about 2 hours PTA.  Pt states he was making a wood bench that accidentally fell onto his toe.  He has been soaking it in a bath of warm water to help with pain and bleeding but no relief. He has not taken any pain medication PTA. Pain is aching and throbbing, 5/10 at worst.  Worse with weight bearing, palpation and movement.  Bleeding moderately controlled PTA as it has clotted some. Denies any other injuries.  He is not on blood thinners.  Pt f/u with his PCP regularly and believes he is UTD on his Tetanus.   Past Medical History  Diagnosis Date  . Diabetes [facility])    Past Surgical History  Procedure Laterality Date  . Rotator cuff surgeries  2376,2831   Family History  Problem Relation Age of Onset  . Prostate cancer Father    Social History  Substance Use Topics  . Smoking status: Never Smoker   . Smokeless tobacco: None  . Alcohol Use: Yes    Review of Systems  Musculoskeletal: Positive for myalgias and arthralgias. Negative for joint swelling.       Right second toe  Skin: Positive for wound. Negative for color change.    Allergies  Review of patient's allergies indicates no known allergies.  Home Medications   Prior to Admission medications   Medication Sig Start Date End Date Taking? Authorizing Provider  diclofenac sodium (VOLTAREN) 1 % GEL Apply 4 g topically 4 (four) times daily. 11/03/15   Gregor Hams, MD  doxycycline (VIBRAMYCIN) 100 MG capsule Take 1 capsule (100 mg total) by mouth 2 (two) times daily. One po bid x 7 days 12/16/15   Noland Fordyce, PA-C    HYDROcodone-acetaminophen (NORCO/VICODIN) 5-325 MG tablet Take 1-2 tablets by mouth every 6 (six) hours as needed for moderate pain or severe pain. 12/16/15   Noland Fordyce, PA-C  ibuprofen (ADVIL,MOTRIN) 600 MG tablet Take 1 tablet (600 mg total) by mouth every 6 (six) hours as needed. 12/16/15   Noland Fordyce, PA-C  metFORMIN (GLUCOPHAGE) 1000 MG tablet One tablet by mouth every evening for blood sugar control. 11/03/15 11/02/16  Marcial Pacas, DO  traZODone (DESYREL) 100 MG tablet Take 1 tablet (100 mg total) by mouth at bedtime. 10/31/15   Marcial Pacas, DO   Meds Ordered and Administered this Visit   Medications  ibuprofen (ADVIL,MOTRIN) tablet 800 mg (800 mg Oral Given 12/16/15 1705)    BP 149/87 mmHg  Pulse 85  Temp(Src) 98.3 F (36.8 C) (Oral)  Resp 18  Ht 5' 10"  (1.778 m)  Wt 215 lb (97.523 kg)  BMI 30.85 kg/m2  SpO2 98% No data found.   Physical Exam  Constitutional: He is oriented to person, place, and time. He appears well-developed and well-nourished.  HENT:  Head: Normocephalic and atraumatic.  Eyes: EOM are normal.  Neck: Normal range of motion.  Cardiovascular: Normal rate.   Right second toe: oozing red blood, good circulation.  Pulmonary/Chest: Effort normal.  Musculoskeletal: Normal range of motion. He exhibits tenderness. He exhibits no edema.  Right second toe: tenderness to PIP and distal  aspect of toe. Limited ROM due to pain (see skin exam). No tenderness to great toe or third toe.   Neurological: He is alert and oriented to person, place, and time.  Skin: Skin is warm and dry.  Right second toe: moderate sized blood clot at base of nail.  Oozing red blood.  Nail appears to be minimally attached by the sides with skin.  Nail matrix appears significantly disrupted.   Psychiatric: He has a normal mood and affect. His behavior is normal.  Nursing note and vitals reviewed.      ED Course  .Marland KitchenLaceration Repair Date/Time: 12/16/2015 6:26 PM Performed by:  Noland Fordyce Authorized by: Theone Murdoch A Consent: Verbal consent obtained. Risks and benefits: risks, benefits and alternatives were discussed Consent given by: patient Patient understanding: patient states understanding of the procedure being performed Patient consent: the patient's understanding of the procedure matches consent given Imaging studies: imaging studies available Required items: required blood products, implants, devices, and special equipment available Patient identity confirmed: verbally with patient Body area: lower extremity Location details: right second toe Laceration length: 2 cm Foreign bodies: no foreign bodies Tendon involvement: none Nerve involvement: superficial Anesthesia: digital block Local anesthetic: lidocaine 2% without epinephrine Anesthetic total: 2 ml Patient sedated: no Preparation: Patient was prepped and draped in the usual sterile fashion. Irrigation solution: saline Irrigation method: syringe Amount of cleaning: extensive Debridement: minimal Wound subcutaneous closure material used: 6-0 Vicryl. Number of sutures: 3 Technique: simple Approximation: loose Approximation difficulty: complex Dressing: 4x4 sterile gauze and splint Patient tolerance: Patient tolerated the procedure well with no immediate complications   (including critical care time)  Labs Review Labs Reviewed - No data to display  Imaging Review Dg Foot Complete Right  12/16/2015  CLINICAL DATA:  Injury to the right foot/ second toe. Swelling and bleeding. Initial encounter. EXAM: RIGHT FOOT COMPLETE - 3+ VIEW COMPARISON:  None. FINDINGS: Bipartite medial sesamoid bone. Hallux valgus deformity. Normal anatomic alignment. There is a transverse fracture through the distal aspect of the distal phalanx of the second toe with overlying soft tissue swelling. IMPRESSION: Transverse fracture through the distal aspect of the distal phalanx of the second toe with overlying soft  tissue swelling. Electronically Signed   By: Lovey Newcomer M.D.   On: 12/16/2015 17:30      MDM   1. Open toe fracture, right, initial encounter   2. Crushing injury of second toe, right, initial encounter   3. Nailbed injury, right, initial encounter    Pt presenting to [facility] with c/o Right 2nd toe pain after a crush injury about 2 hours PTA.   Tetanus is UTD.  Plain films: transverse fracture through the distal aspect of distal phalanx of 2nd toe with overlying soft tissue swelling.  Consulted with Dr. Dianah Field, Sports Medicine.  Agreed to f/u with pt later this week/early next week.  Nail sutured back into anatomic position with 6-0 Vicryl as discussed above.   Pressure bandage with buddy taping applied. Post-op shoe given. Pt declined crutches. Rx: Norco, Ibuprofen, and Doxycycline  Agrees Sports Medicine tomorrow to schedule f/u appointment.  Home care instructions provided. Patient verbalized understanding and agreement with treatment plan.     Noland Fordyce, PA-C 12/16/15 1904

## 2015-12-16 NOTE — ED Notes (Signed)
Pt reports dropping a bench on his RT second toe 2 hours ago at home. No OTC meds.

## 2015-12-16 NOTE — Discharge Instructions (Signed)
°  Norco/Vicodin (hydrocodone-acetaminophen) is a narcotic pain medication, do not combine these medications with others containing tylenol. While taking, do not drink alcohol, drive, or perform any other activities that requires focus while taking these medications.  ° °

## 2015-12-16 NOTE — ED Notes (Signed)
Last Tdap 06/2014'. Charna Archer, LPN

## 2015-12-19 ENCOUNTER — Encounter: Payer: Self-pay | Admitting: Sports Medicine

## 2015-12-19 ENCOUNTER — Ambulatory Visit (INDEPENDENT_AMBULATORY_CARE_PROVIDER_SITE_OTHER): Payer: 59 | Admitting: Sports Medicine

## 2015-12-19 VITALS — BP 146/86 | HR 76 | Resp 18 | Wt 223.7 lb

## 2015-12-19 DIAGNOSIS — S92414B Nondisplaced fracture of proximal phalanx of right great toe, initial encounter for open fracture: Secondary | ICD-10-CM | POA: Diagnosis not present

## 2015-12-19 DIAGNOSIS — S99921A Unspecified injury of right foot, initial encounter: Secondary | ICD-10-CM | POA: Insufficient documentation

## 2015-12-19 NOTE — Progress Notes (Signed)
   Subjective:    I'm seeing this patient as a consultation for:  Noland Fordyce PA-C  CC: right second toe injury  HPI: This is a pleasant 55 year old male, he dropped a shelf on his foot, he was seen in urgent care with a laceration at the proximal nail fold as well as a topical fracture of the distal phalanx. He was sutured appropriately, placed in a postop shoe and referred to me for further evaluation and definitive treatment.  pain is moderate, persistent, no radiation.  Past medical history, Surgical history, Family history not pertinant except as noted below, Social history, Allergies, and medications have been entered into the medical record, reviewed, and no changes needed.   Review of Systems: No headache, visual changes, nausea, vomiting, diarrhea, constipation, dizziness, abdominal pain, skin rash, fevers, chills, night sweats, weight loss, swollen lymph nodes, body aches, joint swelling, muscle aches, chest pain, shortness of breath, mood changes, visual or auditory hallucinations.   Objective:   General: Well Developed, well nourished, and in no acute distress.  Neuro/Psych: Alert and oriented x3, extra-ocular muscles intact, able to move all 4 extremities, sensation grossly intact. Skin: Warm and dry, no rashes noted.  Respiratory: Not using accessory muscles, speaking in full sentences, trachea midline.  Cardiovascular: Pulses palpable, no extremity edema. Abdomen: Does not appear distended. Right Foot: No visible erythema or swelling. Range of motion is full in all directions. Strength is 5/5 in all directions. No hallux valgus. No pes cavus or pes planus. No abnormal callus noted. No pain over the navicular prominence, or base of fifth metatarsal. No tenderness to palpation of the calcaneal insertion of plantar fascia. No pain at the Achilles insertion. No pain over the calcaneal bursa. No pain of the retrocalcaneal bursa. Tender to palpation over the distal  phalanx, there doesn't also appear to be a dermal-epidermal avulsion of the skin over the proximal nailfold. No hallux rigidus or limitus. No tenderness palpation over interphalangeal joints. No pain with compression of the metatarsal heads. Neurovascularly intact distally.  Dermabond applied.  Impression and Recommendations:   This case required medical decision making of moderate complexity.

## 2015-12-19 NOTE — Assessment & Plan Note (Signed)
Second distal phalangeal transverse fracture as well as epidermal dermal skin avulsion over the toenail and the proximal nailfold. Fracture is nondisplaced, he will continue postop shoe, pain medication, and applied Dermabond over the avulsion. He will return to see me in one week for a wound check, out of work for at least 4 weeks now.

## 2016-01-02 ENCOUNTER — Ambulatory Visit (INDEPENDENT_AMBULATORY_CARE_PROVIDER_SITE_OTHER): Payer: 59 | Admitting: Sports Medicine

## 2016-01-02 VITALS — BP 137/86 | HR 79 | Resp 18 | Wt 224.8 lb

## 2016-01-02 DIAGNOSIS — M7712 Lateral epicondylitis, left elbow: Secondary | ICD-10-CM | POA: Diagnosis not present

## 2016-01-02 DIAGNOSIS — S99921D Unspecified injury of right foot, subsequent encounter: Secondary | ICD-10-CM

## 2016-01-02 DIAGNOSIS — M7711 Lateral epicondylitis, right elbow: Secondary | ICD-10-CM | POA: Insufficient documentation

## 2016-01-02 NOTE — Assessment & Plan Note (Signed)
We are going to start rehabilitation exercises, he will return in one month and we will inject if no better.

## 2016-01-02 NOTE — Assessment & Plan Note (Signed)
Skin is a bit macerated but overall appears okay, the nail appears as though it will survive. Continue with buddy taping, and return in one month.

## 2016-01-02 NOTE — Progress Notes (Signed)
  Subjective:    CC: Follow-up  HPI: Right second toe pain: Continues to do well after primary repair of the topical laceration as well as buddy taping with a postop shoe. 2 weeks post injury.  Bilateral elbow pain: Moderate, persistent, localized at the common extensor tendon origin with radiation to the forearm, worse with gripping, and wrist extension.  Past medical history, Surgical history, Family history not pertinant except as noted below, Social history, Allergies, and medications have been entered into the medical record, reviewed, and no changes needed.   Review of Systems: No fevers, chills, night sweats, weight loss, chest pain, or shortness of breath.   Objective:    General: Well Developed, well nourished, and in no acute distress.  Neuro: Alert and oriented x3, extra-ocular muscles intact, sensation grossly intact.  HEENT: Normocephalic, atraumatic, pupils equal round reactive to light, neck supple, no masses, no lymphadenopathy, thyroid nonpalpable.  Skin: Warm and dry, no rashes. Cardiac: Regular rate and rhythm, no murmurs rubs or gallops, no lower extremity edema.  Respiratory: Clear to auscultation bilaterally. Not using accessory muscles, speaking in full sentences. Bilateral elbows: Unremarkable to inspection. Range of motion full pronation, supination, flexion, extension. Strength is full to all of the above directions Stable to varus, valgus stress. Negative moving valgus stress test. Tender to palpation, extensor tendon origin with reproduction of pain with resisted extension of the middle finger on both sides. Ulnar nerve does not sublux. Negative cubital tunnel Tinel's. Right second toe: There is still some swelling and bruising, no signs of bacterial superinfection, the nail appears to be well attached.  The second and third toes were buddy taped together  Impression and Recommendations:

## 2016-01-14 ENCOUNTER — Telehealth: Payer: Self-pay

## 2016-01-14 LAB — HM DIABETES EYE EXAM

## 2016-01-14 NOTE — Telephone Encounter (Signed)
Pt left VM stating his toe is still bleeding and he is due to return to work on Monday. Would like to know if this is normal and what he can do at this point. Please advise.

## 2016-01-14 NOTE — Telephone Encounter (Signed)
Come back to see me sometime this week for a final recheck , otherwise keep pressure on it with gauze and tape

## 2016-01-14 NOTE — Telephone Encounter (Signed)
Pt notified and scheduled an appointment for Friday.

## 2016-01-16 ENCOUNTER — Encounter: Payer: Self-pay | Admitting: Sports Medicine

## 2016-01-16 ENCOUNTER — Ambulatory Visit (INDEPENDENT_AMBULATORY_CARE_PROVIDER_SITE_OTHER): Payer: 59 | Admitting: Sports Medicine

## 2016-01-16 DIAGNOSIS — S99921D Unspecified injury of right foot, subsequent encounter: Secondary | ICD-10-CM | POA: Diagnosis not present

## 2016-01-16 MED ORDER — DOXYCYCLINE HYCLATE 100 MG PO TABS: 100.0000 mg | ORAL_TABLET | Freq: Two times a day (BID) | ORAL | Status: DC

## 2016-01-16 NOTE — Assessment & Plan Note (Signed)
Healing well, skin maceration is improving,  Still with some tenderness over the distal interphalangeal joint.  There is only slight erythema, so we are going to do doxycycline 100 twice a day for 7 days.  Continue daily wound debridement at home, and return to see me in 2 weeks.

## 2016-01-16 NOTE — Progress Notes (Signed)
  Subjective:    CC: follow-up  HPI: 4 weeks post superficial avulsion injury of the toe, with primary repair of the proximal nail fold, toenail has survived however he noted a bit of bleeding and wonders if anything else needs to be done, pain continues to progressively improve.  Past medical history, Surgical history, Family history not pertinant except as noted below, Social history, Allergies, and medications have been entered into the medical record, reviewed, and no changes needed.   Review of Systems: No fevers, chills, night sweats, weight loss, chest pain, or shortness of breath.   Objective:    General: Well Developed, well nourished, and in no acute distress.  Neuro: Alert and oriented x3, extra-ocular muscles intact, sensation grossly intact.  HEENT: Normocephalic, atraumatic, pupils equal round reactive to light, neck supple, no masses, no lymphadenopathy, thyroid nonpalpable.  Skin: Warm and dry, no rashes. Cardiac: Regular rate and rhythm, no murmurs rubs or gallops, no lower extremity edema.  Respiratory: Clear to auscultation bilaterally. Not using accessory muscles, speaking in full sentences. Right foot: Minimal erythema over the toe, minimal pain at the distal interphalangeal joint, toenail appears to have survived, as well as proximal nail fold and matrix.hemostatic.  Impression and Recommendations:

## 2016-01-19 ENCOUNTER — Encounter: Payer: Self-pay | Admitting: Family Medicine

## 2016-01-20 ENCOUNTER — Other Ambulatory Visit: Payer: Self-pay

## 2016-01-20 DIAGNOSIS — S99921D Unspecified injury of right foot, subsequent encounter: Secondary | ICD-10-CM

## 2016-01-20 MED ORDER — DOXYCYCLINE HYCLATE 100 MG PO TABS: 100.0000 mg | ORAL_TABLET | Freq: Two times a day (BID) | ORAL | Status: AC

## 2016-01-20 NOTE — Telephone Encounter (Signed)
Pt called stating wife took antibiotics with her on a trip. Given verbal consent to resend medication in for patient.

## 2016-01-28 ENCOUNTER — Other Ambulatory Visit: Payer: Self-pay | Admitting: Family Medicine

## 2016-01-30 ENCOUNTER — Encounter: Payer: Self-pay | Admitting: Sports Medicine

## 2016-01-30 ENCOUNTER — Ambulatory Visit (INDEPENDENT_AMBULATORY_CARE_PROVIDER_SITE_OTHER): Payer: 59 | Admitting: Sports Medicine

## 2016-01-30 VITALS — BP 130/77 | HR 90 | Resp 18 | Wt 224.5 lb

## 2016-01-30 DIAGNOSIS — S99921D Unspecified injury of right foot, subsequent encounter: Secondary | ICD-10-CM | POA: Diagnosis not present

## 2016-01-30 DIAGNOSIS — M7712 Lateral epicondylitis, left elbow: Secondary | ICD-10-CM

## 2016-01-30 DIAGNOSIS — M7711 Lateral epicondylitis, right elbow: Secondary | ICD-10-CM

## 2016-01-30 NOTE — Assessment & Plan Note (Signed)
Doing well, return as needed

## 2016-01-30 NOTE — Progress Notes (Signed)
  Subjective:    CC:  Follow-up  HPI: This is a pleasant 55 year old male, he has left lateral epicondylitis, he did some of the rehabilitation exercises without any improvement, pain is moderate, persistent and he does desire interventional treatment today  Toe injury: Healed and resolved.  Past medical history, Surgical history, Family history not pertinant except as noted below, Social history, Allergies, and medications have been entered into the medical record, reviewed, and no changes needed.   Review of Systems: No fevers, chills, night sweats, weight loss, chest pain, or shortness of breath.   Objective:    General: Well Developed, well nourished, and in no acute distress.  Neuro: Alert and oriented x3, extra-ocular muscles intact, sensation grossly intact.  HEENT: Normocephalic, atraumatic, pupils equal round reactive to light, neck supple, no masses, no lymphadenopathy, thyroid nonpalpable.  Skin: Warm and dry, no rashes. Cardiac: Regular rate and rhythm, no murmurs rubs or gallops, no lower extremity edema.  Respiratory: Clear to auscultation bilaterally. Not using accessory muscles, speaking in full sentences.  Procedure: Real-time Ultrasound Guided Injection of left common extensor tendon origin Device: GE Logiq E  Verbal informed consent obtained.  Time-out conducted.  Noted no overlying erythema, induration, or other signs of local infection.  Skin prepped in a sterile fashion.  Local anesthesia: Topical Ethyl chloride.  With sterile technique and under real time ultrasound guidance:  While injecting a total of 1 mL kenalog 40, 2 mL lidocaine, 2 mL Marcaine I made several passes through the common extensor tendon into the origin, and injected medication both superficial to and deep to the tendon as well. Completed without difficulty  Pain immediately resolved suggesting accurate placement of the medication.  Advised to call if fevers/chills, erythema, induration,  drainage, or persistent bleeding.  Images permanently stored and available for review in the ultrasound unit.  Impression: Technically successful ultrasound guided injection.  Impression and Recommendations:

## 2016-01-30 NOTE — Assessment & Plan Note (Signed)
Percutaneous needle tenotomy as above, patient already has some hydrocodone left for pain.  return in one month.

## 2016-02-27 ENCOUNTER — Ambulatory Visit: Payer: 59 | Admitting: Sports Medicine

## 2016-08-03 ENCOUNTER — Ambulatory Visit (INDEPENDENT_AMBULATORY_CARE_PROVIDER_SITE_OTHER): Payer: 59 | Admitting: Family Medicine

## 2016-08-03 ENCOUNTER — Encounter: Payer: Self-pay | Admitting: Family Medicine

## 2016-08-03 VITALS — BP 144/87 | HR 83 | Ht 70.0 in | Wt 216.0 lb

## 2016-08-03 DIAGNOSIS — E119 Type 2 diabetes mellitus without complications: Secondary | ICD-10-CM

## 2016-08-03 DIAGNOSIS — Z1159 Encounter for screening for other viral diseases: Secondary | ICD-10-CM

## 2016-08-03 DIAGNOSIS — Z23 Encounter for immunization: Secondary | ICD-10-CM | POA: Diagnosis not present

## 2016-08-03 DIAGNOSIS — Z8042 Family history of malignant neoplasm of prostate: Secondary | ICD-10-CM | POA: Diagnosis not present

## 2016-08-03 DIAGNOSIS — M25511 Pain in right shoulder: Secondary | ICD-10-CM | POA: Diagnosis not present

## 2016-08-03 DIAGNOSIS — Z114 Encounter for screening for human immunodeficiency virus [HIV]: Secondary | ICD-10-CM

## 2016-08-03 LAB — POCT GLYCOSYLATED HEMOGLOBIN (HGB A1C): Hemoglobin A1C: 9.5

## 2016-08-03 MED ORDER — SITAGLIPTIN PHOS-METFORMIN HCL 50-1000 MG PO TABS: 1.0000 | ORAL_TABLET | Freq: Two times a day (BID) | ORAL | 1 refills | Status: DC

## 2016-08-03 NOTE — Progress Notes (Signed)
Cristian Watkins is a 55 y.o. male who presents to [facility] today for right shoulder pain and diabetes.  Right shoulder pain: Patient has ongoing right shoulder pain worse for the last few months. He notes pain with overhead motion reaching back. Pain is moderate and located in the lateral upper arm. Pain is consistent with previous episodes of rotator cuff tendinitis. He's had several right shoulder surgeries in the past. He denies any recent or new injury. He denies any radiating pain weakness or numbness.  Additionally patient notes diabetes. He slept this issue Olegario Messier recently and has not been checking his blood sugars. He denies any polyuria or polydipsia. He notes that he does not take his metformin regularly.   Past Medical History:  Diagnosis Date  . Diabetes [facility])    Past Surgical History:  Procedure Laterality Date  . rotator cuff surgeries  [id number]   Social History  Substance Use Topics  . Smoking status: Never Smoker  . Smokeless tobacco: Not on file  . Alcohol use Yes   family history includes Prostate cancer in his father.  ROS as above:  Medications: Current Outpatient Prescriptions  Medication Sig Dispense Refill  . sitaGLIPtin-metformin (JANUMET) 50-1000 MG tablet Take 1 tablet by mouth 2 (two) times daily with a meal. 60 tablet 1   No current facility-administered medications for this visit.    No Known Allergies   Exam:  BP (!) 144/87   Pulse 83   Ht 5' 10"  (1.778 m)   Wt 216 lb (98 kg)   BMI 30.99 kg/m  Gen: Well NAD HEENT: EOMI,  MMM Lungs: Normal work of breathing. CTABL Heart: RRR no MRG Abd: NABS, Soft. Nondistended, Nontender Exts: Brisk capillary refill, warm and well perfused.  Right shoulder: Normal-appearing nontender. Normal motion pain with abduction. Positive Hawkins and Neer's test. Positive empty can  test. Strength is intact however with external/internal motion. Pulses capillary refill and sensation are intact distally.  Procedure: Real-time Ultrasound Guided Injection of right subacromial bursa  Device: GE Logiq E  Images permanently stored and available for review in the ultrasound unit. Verbal informed consent obtained. Discussed risks and benefits of procedure. Warned about infection bleeding damage to structures skin hypopigmentation and fat atrophy among others. Patient expresses understanding and agreement Time-out conducted.  Noted no overlying erythema, induration, or other signs of local infection.  Skin prepped in a sterile fashion.  Local anesthesia: Topical Ethyl chloride.  With sterile technique and under real time ultrasound guidance: 40 mg of Kenalog and 3 mL of Marcaine injected easily.  Completed without difficulty  Pain immediately resolved suggesting accurate placement of the medication.  Advised to call if fevers/chills, erythema, induration, drainage, or persistent bleeding.  Images permanently stored and available for review in the ultrasound unit.  Impression: Technically successful ultrasound guided injection.  Lot numbers: Kenalog: [id number] Marcaine: [id number]  Results for orders placed or performed in visit on 08/03/16 (from the past 24 hour(s))  POCT HgB A1C     Status: Abnormal   Collection Time: 08/03/16  1:35 PM  Result Value Ref Range   Hemoglobin A1C 9.5    No results found.    Assessment and Plan: 55 y.o. male with  Right shoulder pain: Likely rotator cuff tendinitis/bursitis. Patient had relief with injection. Start home exercise program and recheck in a few weeks.  Diabetes: Not well controlled. Start Janumet obtain fasting labs and recheck in the near  future.  Flu vaccine given.   Orders Placed This Encounter  Procedures  . Flu Vaccine QUAD 36+ mos PF IM (Fluarix & Fluzone Quad PF)  . Pneumococcal conjugate vaccine  13-valent IM  . CBC  . C-reactive protein  . Lipid panel  . Hepatitis C antibody  . HIV antibody  . TSH  . VITAMIN D 25 Hydroxy (Vit-D Deficiency, Fractures)  . PSA  . Comprehensive metabolic panel    Order Specific Question:   Has the patient fasted?    Answer:   No  . POCT HgB A1C    Discussed warning signs or symptoms. Please see discharge instructions. Patient expresses understanding.

## 2016-08-03 NOTE — Patient Instructions (Signed)
Thank you for coming in today. Get fasting labs soon.  Start home exercise program.  Return for well visit soon.  STOP metformin.  START Janumet.  Call or go to the ER if you develop a large red swollen joint with extreme pain or oozing puss.     Impingement Syndrome, Rotator Cuff, Bursitis With Rehab Impingement syndrome is a condition that involves inflammation of the tendons of the rotator cuff and the subacromial bursa, that causes pain in the shoulder. The rotator cuff consists of four tendons and muscles that control much of the shoulder and upper arm function. The subacromial bursa is a fluid filled sac that helps reduce friction between the rotator cuff and one of the bones of the shoulder (acromion). Impingement syndrome is usually an overuse injury that causes swelling of the bursa (bursitis), swelling of the tendon (tendonitis), and/or a tear of the tendon (strain). Strains are classified into three categories. Grade 1 strains cause pain, but the tendon is not lengthened. Grade 2 strains include a lengthened ligament, due to the ligament being stretched or partially ruptured. With grade 2 strains there is still function, although the function may be decreased. Grade 3 strains include a complete tear of the tendon or muscle, and function is usually impaired. SYMPTOMS   Pain around the shoulder, often at the outer portion of the upper arm.  Pain that gets worse with shoulder function, especially when reaching overhead or lifting.  Sometimes, aching when not using the arm.  Pain that wakes you up at night.  Sometimes, tenderness, swelling, warmth, or redness over the affected area.  Loss of strength.  Limited motion of the shoulder, especially reaching behind the back (to the back pocket or to unhook bra) or across your body.  Crackling sound (crepitation) when moving the arm.  Biceps tendon pain and inflammation (in the front of the shoulder). Worse when bending the elbow or  lifting. CAUSES  Impingement syndrome is often an overuse injury, in which chronic (repetitive) motions cause the tendons or bursa to become inflamed. A strain occurs when a force is paced on the tendon or muscle that is greater than it can withstand. Common mechanisms of injury include: Stress from sudden increase in duration, frequency, or intensity of training.  Direct hit (trauma) to the shoulder.  Aging, erosion of the tendon with normal use.  Bony bump on shoulder (acromial spur). RISK INCREASES WITH:  Contact sports (football, wrestling, boxing).  Throwing sports (baseball, tennis, volleyball).  Weightlifting and bodybuilding.  Heavy labor.  Previous injury to the rotator cuff, including impingement.  Poor shoulder strength and flexibility.  Failure to warm up properly before activity.  Inadequate protective equipment.  Old age.  Bony bump on shoulder (acromial spur). PREVENTION   Warm up and stretch properly before activity.  Allow for adequate recovery between workouts.  Maintain physical fitness:  Strength, flexibility, and endurance.  Cardiovascular fitness.  Learn and use proper exercise technique. PROGNOSIS  If treated properly, impingement syndrome usually goes away within 6 weeks. Sometimes surgery is required.  RELATED COMPLICATIONS   Longer healing time if not properly treated, or if not given enough time to heal.  Recurring symptoms, that result in a chronic condition.  Shoulder stiffness, frozen shoulder, or loss of motion.  Rotator cuff tendon tear.  Recurring symptoms, especially if activity is resumed too soon, with overuse, with a direct blow, or when using poor technique. TREATMENT  Treatment first involves the use of ice and medicine, to reduce  pain and inflammation. The use of strengthening and stretching exercises may help reduce pain with activity. These exercises may be performed at home or with a therapist. If non-surgical  treatment is unsuccessful after more than 6 months, surgery may be advised. After surgery and rehabilitation, activity is usually possible in 3 months.  MEDICATION  If pain medicine is needed, nonsteroidal anti-inflammatory medicines (aspirin and ibuprofen), or other minor pain relievers (acetaminophen), are often advised.  Do not take pain medicine for 7 days before surgery.  Prescription pain relievers may be given, if your caregiver thinks they are needed. Use only as directed and only as much as you need.  Corticosteroid injections may be given by your caregiver. These injections should be reserved for the most serious cases, because they may only be given a certain number of times. HEAT AND COLD  Cold treatment (icing) should be applied for 10 to 15 minutes every 2 to 3 hours for inflammation and pain, and immediately after activity that aggravates your symptoms. Use ice packs or an ice massage.  Heat treatment may be used before performing stretching and strengthening activities prescribed by your caregiver, physical therapist, or athletic trainer. Use a heat pack or a warm water soak. SEEK MEDICAL CARE IF:   Symptoms get worse or do not improve in 4 to 6 weeks, despite treatment.  New, unexplained symptoms develop. (Drugs used in treatment may produce side effects.) EXERCISES  RANGE OF MOTION (ROM) AND STRETCHING EXERCISES - Impingement Syndrome (Rotator Cuff  Tendinitis, Bursitis) These exercises may help you when beginning to rehabilitate your injury. Your symptoms may go away with or without further involvement from your physician, physical therapist or athletic trainer. While completing these exercises, remember:   Restoring tissue flexibility helps normal motion to return to the joints. This allows healthier, less painful movement and activity.  An effective stretch should be held for at least 30 seconds.  A stretch should never be painful. You should only feel a gentle  lengthening or release in the stretched tissue. STRETCH - Flexion, Standing  Stand with good posture. With an underhand grip on your right / left hand, and an overhand grip on the opposite hand, grasp a broomstick or cane so that your hands are a little more than shoulder width apart.  Keeping your right / left elbow straight and shoulder muscles relaxed, push the stick with your opposite hand, to raise your right / left arm in front of your body and then overhead. Raise your arm until you feel a stretch in your right / left shoulder, but before you have increased shoulder pain.  Try to avoid shrugging your right / left shoulder as your arm rises, by keeping your shoulder blade tucked down and toward your mid-back spine. Hold for __________ seconds.  Slowly return to the starting position. Repeat __________ times. Complete this exercise __________ times per day. STRETCH - Abduction, Supine  Lie on your back. With an underhand grip on your right / left hand and an overhand grip on the opposite hand, grasp a broomstick or cane so that your hands are a little more than shoulder width apart.  Keeping your right / left elbow straight and your shoulder muscles relaxed, push the stick with your opposite hand, to raise your right / left arm out to the side of your body and then overhead. Raise your arm until you feel a stretch in your right / left shoulder, but before you have increased shoulder pain.  Try to  avoid shrugging your right / left shoulder as your arm rises, by keeping your shoulder blade tucked down and toward your mid-back spine. Hold for __________ seconds.  Slowly return to the starting position. Repeat __________ times. Complete this exercise __________ times per day. ROM - Flexion, Active-Assisted  Lie on your back. You may bend your knees for comfort.  Grasp a broomstick or cane so your hands are about shoulder width apart. Your right / left hand should grip the end of the stick,  so that your hand is positioned "thumbs-up," as if you were about to shake hands.  Using your healthy arm to lead, raise your right / left arm overhead, until you feel a gentle stretch in your shoulder. Hold for __________ seconds.  Use the stick to assist in returning your right / left arm to its starting position. Repeat __________ times. Complete this exercise __________ times per day.  ROM - Internal Rotation, Supine   Lie on your back on a firm surface. Place your right / left elbow about 60 degrees away from your side. Elevate your elbow with a folded towel, so that the elbow and shoulder are the same height.  Using a broomstick or cane and your strong arm, pull your right / left hand toward your body until you feel a gentle stretch, but no increase in your shoulder pain. Keep your shoulder and elbow in place throughout the exercise.  Hold for __________ seconds. Slowly return to the starting position. Repeat __________ times. Complete this exercise __________ times per day. STRETCH - Internal Rotation  Place your right / left hand behind your back, palm up.  Throw a towel or belt over your opposite shoulder. Grasp the towel with your right / left hand.  While keeping an upright posture, gently pull up on the towel, until you feel a stretch in the front of your right / left shoulder.  Avoid shrugging your right / left shoulder as your arm rises, by keeping your shoulder blade tucked down and toward your mid-back spine.  Hold for __________ seconds. Release the stretch, by lowering your healthy hand. Repeat __________ times. Complete this exercise __________ times per day. ROM - Internal Rotation   Using an underhand grip, grasp a stick behind your back with both hands.  While standing upright with good posture, slide the stick up your back until you feel a mild stretch in the front of your shoulder.  Hold for __________ seconds. Slowly return to your starting position. Repeat  __________ times. Complete this exercise __________ times per day.  STRETCH - Posterior Shoulder Capsule   Stand or sit with good posture. Grasp your right / left elbow and draw it across your chest, keeping it at the same height as your shoulder.  Pull your elbow, so your upper arm comes in closer to your chest. Pull until you feel a gentle stretch in the back of your shoulder.  Hold for __________ seconds. Repeat __________ times. Complete this exercise __________ times per day. STRENGTHENING EXERCISES - Impingement Syndrome (Rotator Cuff Tendinitis, Bursitis) These exercises may help you when beginning to rehabilitate your injury. They may resolve your symptoms with or without further involvement from your physician, physical therapist or athletic trainer. While completing these exercises, remember:  Muscles can gain both the endurance and the strength needed for everyday activities through controlled exercises.  Complete these exercises as instructed by your physician, physical therapist or athletic trainer. Increase the resistance and repetitions only as guided.  You  may experience muscle soreness or fatigue, but the pain or discomfort you are trying to eliminate should never worsen during these exercises. If this pain does get worse, stop and make sure you are following the directions exactly. If the pain is still present after adjustments, discontinue the exercise until you can discuss the trouble with your clinician.  During your recovery, avoid activity or exercises which involve actions that place your injured hand or elbow above your head or behind your back or head. These positions stress the tissues which you are trying to heal. STRENGTH - Scapular Depression and Adduction   With good posture, sit on a firm chair. Support your arms in front of you, with pillows, arm rests, or on a table top. Have your elbows in line with the sides of your body.  Gently draw your shoulder blades  down and toward your mid-back spine. Gradually increase the tension, without tensing the muscles along the top of your shoulders and the back of your neck.  Hold for __________ seconds. Slowly release the tension and relax your muscles completely before starting the next repetition.  After you have practiced this exercise, remove the arm support and complete the exercise in standing as well as sitting position. Repeat __________ times. Complete this exercise __________ times per day.  STRENGTH - Shoulder Abductors, Isometric  With good posture, stand or sit about 4-6 inches from a wall, with your right / left side facing the wall.  Bend your right / left elbow. Gently press your right / left elbow into the wall. Increase the pressure gradually, until you are pressing as hard as you can, without shrugging your shoulder or increasing any shoulder discomfort.  Hold for __________ seconds.  Release the tension slowly. Relax your shoulder muscles completely before you begin the next repetition. Repeat __________ times. Complete this exercise __________ times per day.  STRENGTH - External Rotators, Isometric  Keep your right / left elbow at your side and bend it 90 degrees.  Step into a door frame so that the outside of your right / left wrist can press against the door frame without your upper arm leaving your side.  Gently press your right / left wrist into the door frame, as if you were trying to swing the back of your hand away from your stomach. Gradually increase the tension, until you are pressing as hard as you can, without shrugging your shoulder or increasing any shoulder discomfort.  Hold for __________ seconds.  Release the tension slowly. Relax your shoulder muscles completely before you begin the next repetition. Repeat __________ times. Complete this exercise __________ times per day.  STRENGTH - Supraspinatus   Stand or sit with good posture. Grasp a __________ weight, or an  exercise band or tubing, so that your hand is "thumbs-up," like you are shaking hands.  Slowly lift your right / left arm in a "V" away from your thigh, diagonally into the space between your side and straight ahead. Lift your hand to shoulder height or as far as you can, without increasing any shoulder pain. At first, many people do not lift their hands above shoulder height.  Avoid shrugging your right / left shoulder as your arm rises, by keeping your shoulder blade tucked down and toward your mid-back spine.  Hold for __________ seconds. Control the descent of your hand, as you slowly return to your starting position. Repeat __________ times. Complete this exercise __________ times per day.  STRENGTH - External Rotators  Secure  a rubber exercise band or tubing to a fixed object (table, pole) so that it is at the same height as your right / left elbow when you are standing or sitting on a firm surface.  Stand or sit so that the secured exercise band is at your uninjured side.  Bend your right / left elbow 90 degrees. Place a folded towel or small pillow under your right / left arm, so that your elbow is a few inches away from your side.  Keeping the tension on the exercise band, pull it away from your body, as if pivoting on your elbow. Be sure to keep your body steady, so that the movement is coming only from your rotating shoulder.  Hold for __________ seconds. Release the tension in a controlled manner, as you return to the starting position. Repeat __________ times. Complete this exercise __________ times per day.  STRENGTH - Internal Rotators   Secure a rubber exercise band or tubing to a fixed object (table, pole) so that it is at the same height as your right / left elbow when you are standing or sitting on a firm surface.  Stand or sit so that the secured exercise band is at your right / left side.  Bend your elbow 90 degrees. Place a folded towel or small pillow under your right  / left arm so that your elbow is a few inches away from your side.  Keeping the tension on the exercise band, pull it across your body, toward your stomach. Be sure to keep your body steady, so that the movement is coming only from your rotating shoulder.  Hold for __________ seconds. Release the tension in a controlled manner, as you return to the starting position. Repeat __________ times. Complete this exercise __________ times per day.  STRENGTH - Scapular Protractors, Standing   Stand arms length away from a wall. Place your hands on the wall, keeping your elbows straight.  Begin by dropping your shoulder blades down and toward your mid-back spine.  To strengthen your protractors, keep your shoulder blades down, but slide them forward on your rib cage. It will feel as if you are lifting the back of your rib cage away from the wall. This is a subtle motion and can be challenging to complete. Ask your caregiver for further instruction, if you are not sure you are doing the exercise correctly.  Hold for __________ seconds. Slowly return to the starting position, resting the muscles completely before starting the next repetition. Repeat __________ times. Complete this exercise __________ times per day. STRENGTH - Scapular Protractors, Supine  Lie on your back on a firm surface. Extend your right / left arm straight into the air while holding a __________ weight in your hand.  Keeping your head and back in place, lift your shoulder off the floor.  Hold for __________ seconds. Slowly return to the starting position, and allow your muscles to relax completely before starting the next repetition. Repeat __________ times. Complete this exercise __________ times per day. STRENGTH - Scapular Protractors, Quadruped  Get onto your hands and knees, with your shoulders directly over your hands (or as close as you can be, comfortably).  Keeping your elbows locked, lift the back of your rib cage up  into your shoulder blades, so your mid-back rounds out. Keep your neck muscles relaxed.  Hold this position for __________ seconds. Slowly return to the starting position and allow your muscles to relax completely before starting the next repetition. Repeat  __________ times. Complete this exercise __________ times per day.  STRENGTH - Scapular Retractors  Secure a rubber exercise band or tubing to a fixed object (table, pole), so that it is at the height of your shoulders when you are either standing, or sitting on a firm armless chair.  With a palm down grip, grasp an end of the band in each hand. Straighten your elbows and lift your hands straight in front of you, at shoulder height. Step back, away from the secured end of the band, until it becomes tense.  Squeezing your shoulder blades together, draw your elbows back toward your sides, as you bend them. Keep your upper arms lifted away from your body throughout the exercise.  Hold for __________ seconds. Slowly ease the tension on the band, as you reverse the directions and return to the starting position. Repeat __________ times. Complete this exercise __________ times per day. STRENGTH - Shoulder Extensors   Secure a rubber exercise band or tubing to a fixed object (table, pole) so that it is at the height of your shoulders when you are either standing, or sitting on a firm armless chair.  With a thumbs-up grip, grasp an end of the band in each hand. Straighten your elbows and lift your hands straight in front of you, at shoulder height. Step back, away from the secured end of the band, until it becomes tense.  Squeezing your shoulder blades together, pull your hands down to the sides of your thighs. Do not allow your hands to go behind you.  Hold for __________ seconds. Slowly ease the tension on the band, as you reverse the directions and return to the starting position. Repeat __________ times. Complete this exercise __________ times  per day.  STRENGTH - Scapular Retractors and External Rotators   Secure a rubber exercise band or tubing to a fixed object (table, pole) so that it is at the height as your shoulders, when you are either standing, or sitting on a firm armless chair.  With a palm down grip, grasp an end of the band in each hand. Bend your elbows 90 degrees and lift your elbows to shoulder height, at your sides. Step back, away from the secured end of the band, until it becomes tense.  Squeezing your shoulder blades together, rotate your shoulders so that your upper arms and elbows remain stationary, but your fists travel upward to head height.  Hold for __________ seconds. Slowly ease the tension on the band, as you reverse the directions and return to the starting position. Repeat __________ times. Complete this exercise __________ times per day.  STRENGTH - Scapular Retractors and External Rotators, Rowing   Secure a rubber exercise band or tubing to a fixed object (table, pole) so that it is at the height of your shoulders, when you are either standing, or sitting on a firm armless chair.  With a palm down grip, grasp an end of the band in each hand. Straighten your elbows and lift your hands straight in front of you, at shoulder height. Step back, away from the secured end of the band, until it becomes tense.  Step 1: Squeeze your shoulder blades together. Bending your elbows, draw your hands to your chest, as if you are rowing a boat. At the end of this motion, your hands and elbow should be at shoulder height and your elbows should be out to your sides.  Step 2: Rotate your shoulders, to raise your hands above your head. Your forearms  should be vertical and your upper arms should be horizontal.  Hold for __________ seconds. Slowly ease the tension on the band, as you reverse the directions and return to the starting position. Repeat __________ times. Complete this exercise __________ times per day.    STRENGTH - Scapular Depressors  Find a sturdy chair without wheels, such as a dining room chair.  Keeping your feet on the floor, and your hands on the chair arms, lift your bottom up from the seat, and lock your elbows.  Keeping your elbows straight, allow gravity to pull your body weight down. Your shoulders will rise toward your ears.  Raise your body against gravity by drawing your shoulder blades down your back, shortening the distance between your shoulders and ears. Although your feet should always maintain contact with the floor, your feet should progressively support less body weight, as you get stronger.  Hold for __________ seconds. In a controlled and slow manner, lower your body weight to begin the next repetition. Repeat __________ times. Complete this exercise __________ times per day.    This information is not intended to replace advice given to you by your health care provider. Make sure you discuss any questions you have with your health care provider.   Document Released: 11/08/2005 Document Revised: 11/29/2014 Document Reviewed: 02/20/2009 [employer] Interactive Patient Education [employer].

## 2016-08-04 ENCOUNTER — Telehealth: Payer: Self-pay | Admitting: *Deleted

## 2016-08-04 NOTE — Telephone Encounter (Signed)
PA submitted through covermymeds for janumet. This may be denied since patient has only been on metformin.  [facility] - PA Case ID: [id number]

## 2016-08-11 NOTE — Telephone Encounter (Signed)
janumet has been denied/letter placed in providers box.

## 2016-08-13 ENCOUNTER — Encounter: Payer: Self-pay | Admitting: Family Medicine

## 2016-08-13 DIAGNOSIS — R7982 Elevated C-reactive protein (CRP): Secondary | ICD-10-CM | POA: Insufficient documentation

## 2016-08-13 LAB — COMPREHENSIVE METABOLIC PANEL
ALT: 30 U/L (ref 9–46)
AST: 21 U/L (ref 10–35)
Albumin: 4.3 g/dL (ref 3.6–5.1)
Alkaline Phosphatase: 49 U/L (ref 40–115)
BUN: 12 mg/dL (ref 7–25)
CHLORIDE: 102 mmol/L (ref 98–110)
CO2: 22 mmol/L (ref 20–31)
CREATININE: 0.9 mg/dL (ref 0.70–1.33)
Calcium: 9.3 mg/dL (ref 8.6–10.3)
Glucose, Bld: 133 mg/dL — ABNORMAL HIGH (ref 65–99)
Potassium: 4.3 mmol/L (ref 3.5–5.3)
SODIUM: 136 mmol/L (ref 135–146)
TOTAL PROTEIN: 6.6 g/dL (ref 6.1–8.1)
Total Bilirubin: 0.8 mg/dL (ref 0.2–1.2)

## 2016-08-13 LAB — CELIAC PANEL 10
Endomysial Screen: NEGATIVE
GLIADIN IGA: 8 U (ref <20)
Gliadin IgG: 2 Units (ref <20)
IGA: 289 mg/dL (ref 81–463)
TISSUE TRANSGLUT AB: 1 U/mL (ref <6)
Tissue Transglutaminase Ab, IgA: 1 U/mL (ref <4)

## 2016-08-13 LAB — CBC
HEMATOCRIT: 45.1 % (ref 38.5–50.0)
HEMOGLOBIN: 15.2 g/dL (ref 13.2–17.1)
MCH: 27.5 pg (ref 27.0–33.0)
MCHC: 33.7 g/dL (ref 32.0–36.0)
MCV: 81.7 fL (ref 80.0–100.0)
MPV: 10.6 fL (ref 7.5–12.5)
Platelets: 227 10*3/uL (ref 140–400)
RBC: 5.52 MIL/uL (ref 4.20–5.80)
RDW: 14.7 % (ref 11.0–15.0)
WBC: 5.9 10*3/uL (ref 3.8–10.8)

## 2016-08-13 LAB — LIPID PANEL
CHOL/HDL RATIO: 2.1 ratio (ref <=5.0)
CHOLESTEROL: 133 mg/dL (ref 125–200)
HDL: 62 mg/dL (ref >=40)
LDL CALC: 59 mg/dL (ref <130)
Triglycerides: 59 mg/dL (ref <150)
VLDL: 12 mg/dL (ref <30)

## 2016-08-13 LAB — VITAMIN D 25 HYDROXY (VIT D DEFICIENCY, FRACTURES): Vit D, 25-Hydroxy: 30 ng/mL (ref 30–100)

## 2016-08-13 LAB — PSA: PSA: 1.9 ng/mL (ref <=4.0)

## 2016-08-13 LAB — HIV ANTIBODY (ROUTINE TESTING W REFLEX): HIV 1&2 Ab, 4th Generation: NONREACTIVE

## 2016-08-13 LAB — C-REACTIVE PROTEIN: CRP: 10.5 mg/L — ABNORMAL HIGH (ref <8.0)

## 2016-08-13 LAB — TSH: TSH: 1.02 mIU/L (ref 0.40–4.50)

## 2016-08-13 LAB — HEPATITIS C ANTIBODY: HCV AB: NEGATIVE

## 2016-08-16 ENCOUNTER — Other Ambulatory Visit: Payer: Self-pay | Admitting: Family Medicine

## 2016-08-16 MED ORDER — SAXAGLIPTIN-METFORMIN ER 2.5-1000 MG PO TB24: 1.0000 | ORAL_TABLET | Freq: Two times a day (BID) | ORAL | 0 refills | Status: DC

## 2016-08-16 NOTE — Progress Notes (Signed)
Janumet not covered.  Kombiglyze XR is covered. Will switch.

## 2016-08-18 NOTE — Progress Notes (Signed)
Left vm requesting call back.

## 2016-08-31 ENCOUNTER — Encounter: Payer: Self-pay | Admitting: Family Medicine

## 2016-08-31 ENCOUNTER — Ambulatory Visit (INDEPENDENT_AMBULATORY_CARE_PROVIDER_SITE_OTHER): Payer: 59 | Admitting: Family Medicine

## 2016-08-31 VITALS — BP 139/86 | HR 84 | Wt 215.0 lb

## 2016-08-31 DIAGNOSIS — M25511 Pain in right shoulder: Secondary | ICD-10-CM

## 2016-08-31 NOTE — Progress Notes (Signed)
       Cristian Watkins is a 55 y.o. male who presents to [facility] today for right shoulder pain. Patient notes continued right shoulder pain. He had a diagnostic and therapeutic subacromial bursa injection in September which provided symptom relief for only a few hours. The pain nearly completely resolved however following injection. He denies any radiating pain weakness or numbness but does note continued shoulder pain with overhead motion reaching back consistent with similar shoulder pain he's had in the past preceding rotator cuff surgery repair.   Past Medical History:  Diagnosis Date  . Diabetes [facility])    Past Surgical History:  Procedure Laterality Date  . rotator cuff surgeries  0932,3557   Social History  Substance Use Topics  . Smoking status: Never Smoker  . Smokeless tobacco: Not on file  . Alcohol use Yes   family history includes Prostate cancer in his father.  ROS as above:  Medications: Current Outpatient Prescriptions  Medication Sig Dispense Refill  . Saxagliptin-Metformin 2.03-999 MG TB24 Take 1 tablet by mouth 2 (two) times daily. 180 tablet 0   No current facility-administered medications for this visit.    No Known Allergies   Exam:  BP 139/86   Pulse 84   Wt 215 lb (97.5 kg)   BMI 30.85 kg/m  Gen: Well NAD Right shoulder normal-appearing. Decreased motion due to pain.  No results found for this or any previous visit (from the past 24 hour(s)). No results found.    Assessment and Plan: 55 y.o. male with right shoulder pain. Concerning for subacromial bursitis or rotator cuff tendinitis or tear based on excellent symptom relief for a few hours with subacromial bursa injection. This patient has failed conservative management at this point would recommend MRI for potential surgical planning. Plan for open MRI of the right shoulder in  the near future with follow-up after MRI obtained.   Orders Placed This Encounter  Procedures  . MR Shoulder Right Wo Contrast    Standing Status:   Future    Standing Expiration Date:   10/31/2017    Scheduling Instructions:     OPEN MRI NEEDED    Order Specific Question:   Does the patient have a pacemaker or implanted devices?    Answer:   No    Order Specific Question:   Preferred imaging location?    Answer:   [facility] (table limit-550lbs)    Order Specific Question:   Reason for exam:    Answer:   Shoulder Pain suspect RTC tear. OPEN MRI NEEDED.    Discussed warning signs or symptoms. Please see discharge instructions. Patient expresses understanding.

## 2016-08-31 NOTE — Patient Instructions (Signed)
Thank you for coming in today. You should hear from MRI people soon about the scheduling.  Please return to my clinic a few days after the MRI.

## 2016-09-10 ENCOUNTER — Ambulatory Visit
Admission: RE | Admit: 2016-09-10 | Discharge: 2016-09-10 | Disposition: A | Discharge status: Home / Self Care | Payer: 59 | Source: Ambulatory Visit | Attending: Family Medicine | Admitting: Family Medicine

## 2016-09-10 DIAGNOSIS — M25511 Pain in right shoulder: Secondary | ICD-10-CM

## 2016-09-15 ENCOUNTER — Ambulatory Visit (INDEPENDENT_AMBULATORY_CARE_PROVIDER_SITE_OTHER): Payer: 59 | Admitting: Family Medicine

## 2016-09-15 ENCOUNTER — Encounter: Payer: Self-pay | Admitting: Family Medicine

## 2016-09-15 DIAGNOSIS — S4381XA Sprain of other specified parts of right shoulder girdle, initial encounter: Secondary | ICD-10-CM

## 2016-09-15 DIAGNOSIS — M19011 Primary osteoarthritis, right shoulder: Secondary | ICD-10-CM | POA: Insufficient documentation

## 2016-09-15 DIAGNOSIS — S43004A Unspecified dislocation of right shoulder joint, initial encounter: Secondary | ICD-10-CM | POA: Insufficient documentation

## 2016-09-15 DIAGNOSIS — S46811A Strain of other muscles, fascia and tendons at shoulder and upper arm level, right arm, initial encounter: Secondary | ICD-10-CM

## 2016-09-15 NOTE — Patient Instructions (Signed)
Thank you for coming in today. Call or go to the ER if you develop a large red swollen joint with extreme pain or oozing puss.  Return as needed or in mid January to follow up diabetes.

## 2016-09-15 NOTE — Progress Notes (Signed)
Cristian Watkins is a 55 y.o. male who presents to [facility] today for follow up right shoulder pain.   Patient has ongoing right shoulder pain. He's received subacromial bursa injections which help temporarily but did not last long at all. Recently he received an MRI of his right shoulder that showed bursal surface full-thickness partial with subscapularis tear along with tendinosis of the subscapularis supraspinatus and infraspinatus. 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 He notes constant pain interfere with his ability to work and interfering with sleep. He is very frustrated with his pain. No fevers or chills nausea vomiting or diarrhea.   Past Medical History:  Diagnosis Date  . Diabetes [facility])    Past Surgical History:  Procedure Laterality Date  . rotator cuff surgeries  1610,9604   Social History  Substance Use Topics  . Smoking status: Never Smoker  . Smokeless tobacco: Not on file  . Alcohol use Yes     ROS:  As above   Medications: Current Outpatient Prescriptions  Medication Sig Dispense Refill  . Saxagliptin-Metformin 2.03-999 MG TB24 Take 1 tablet by mouth 2 (two) times daily. 180 tablet 0   No current facility-administered medications for this visit.    No Known Allergies   Exam:  BP (!) 143/95   Pulse 85   Wt 214 lb (97.1 kg)   BMI 30.71 kg/m  General: Well Developed, well nourished, and in no acute distress.  Neuro/Psych: Alert and oriented x3, extra-ocular muscles intact, able to move all 4 extremities, sensation grossly intact. Skin: Warm and dry, no rashes noted.  Respiratory: Not using accessory muscles, speaking in full sentences, trachea midline.  Cardiovascular: Pulses palpable, no extremity edema. Abdomen: Does not appear distended. MSK: Right shoulder normal-appearing decreased motion due to pain. Positive impingement testing.   Procedure: Real-time Ultrasound  Guided Injection of right glenohumeral joint  Device: GE Logiq E  Images permanently stored and available for review in the ultrasound unit. Verbal informed consent obtained. Discussed risks and benefits of procedure. Warned about infection bleeding damage to structures skin hypopigmentation and fat atrophy among others. Patient expresses understanding and agreement Time-out conducted.  Noted no overlying erythema, induration, or other signs of local infection.  Skin prepped in a sterile fashion.  Local anesthesia: Topical Ethyl chloride.  With sterile technique and under real time ultrasound guidance: 40 mg of Kenalog and 3 mL of Marcaine injected easily.  Completed without difficulty  Pain did not immediately resolve. However the fluid was seen flowing into the glenohumeral space on ultrasound confirming location of injection. Advised to call if fevers/chills, erythema, induration, drainage, or persistent bleeding.  Images permanently stored and available for review in the ultrasound unit.  Impression: Technically successful ultrasound guided injection.     Study Result   CLINICAL DATA:  Right shoulder pain starting 6 months ago. Two prior right shoulder surgeries.  EXAM: MRI OF THE RIGHT SHOULDER WITHOUT CONTRAST  TECHNIQUE: Multiplanar, multisequence MR imaging of the shoulder was performed. No intravenous contrast was administered.  COMPARISON:  05/31/2009  FINDINGS: Rotator cuff: Full-thickness partial width tearing of the subscapularis tendon with associated partial thickness articular surface tearing. Moderate to prominent supraspinatus and infraspinatus tendinopathy with moderate subscapularis tendinopathy.  Muscles:  Unremarkable  Biceps long head: Nonvisualization of the intra-articular segment. Questionable low biceps tenodesis, images 9-11 series 7.  Acromioclavicular Joint: Postoperative findings with distal clavicular resection and  questionable acromioplasty. Type I acromion. As expected  there is fluid in the subacromial subdeltoid bursa.  Glenohumeral Joint: Glenohumeral joint effusion. Prominent chondral thinning in the glenoid and humeral head with mild associated spurring.  Labrum: There is some blunting of the superior and posterior labrum but I do not see a well-defined tear or flap.  Bones: No additional significant extra-articular osseous abnormalities identified.  Other: No supplemental non-categorized findings.  IMPRESSION: 1. Full-thickness partial width tear of the subscapularis with prominent partial thickness articular surface tearing. Resulting small amount of fluid in the subacromial subdeltoid bursa and subcoracoid bursa. 2. 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 3. Moderate to prominent supraspinatus and infraspinatus tendinopathy with moderate subscapularis tendinopathy. 4. Nonvisualization of the intra-articular segment of the long head of the biceps, I suspect a prior low biceps tenodesis.   Electronically Signed   By: Van Clines M.D.   On: 09/11/2016 09:07       Assessment and Plan: 55 y.o. male with Right shoulder pain. The source of pain is a bit confounding as he has multiple potential pain generators including subscapularis tendinitis with tear as well as supraspinatus and infraspinatus tendinosis. Additionally his glenohumeral DJD. A few minutes after ultrasound-guided glenohumeral injection he was not feeling much better. I suspect that although he does have glenohumeral DJD that is severe on MRI his main pain generator is his rotator cuff tear and tendinosis. I think he would be well served with surgical consultation to evaluate arthroscopic rotator cuff repair and decompression as a more limited surgical approach instead of total shoulder replacement. Plan to refer to orthopedics.       No orders of the defined  types were placed in this encounter.   Discussed warning signs or symptoms. Please see discharge instructions. Patient expresses understanding.

## 2016-09-21 ENCOUNTER — Telehealth: Payer: Self-pay | Admitting: Family Medicine

## 2016-09-21 NOTE — Telephone Encounter (Signed)
I received disability paperwork from [employer] that has a lot of detailed questions. This is typically much more accurate and better if you come in to clinic and schedule a visit where we fill out the form out together to make sure we get it 100% correct. I strongly recommend Cristian Watkins make an appointment in the near future for disability form filled out.

## 2016-09-21 NOTE — Telephone Encounter (Signed)
Pt notified and transferred to the front desk

## 2016-10-01 ENCOUNTER — Encounter: Payer: Self-pay | Admitting: Family Medicine

## 2016-10-01 ENCOUNTER — Ambulatory Visit: Payer: 59 | Admitting: Family Medicine

## 2016-10-08 ENCOUNTER — Encounter: Payer: Self-pay | Admitting: Family Medicine

## 2016-10-08 ENCOUNTER — Ambulatory Visit (INDEPENDENT_AMBULATORY_CARE_PROVIDER_SITE_OTHER): Payer: 59 | Admitting: Family Medicine

## 2016-10-08 VITALS — BP 133/81 | HR 96 | Ht 70.0 in | Wt 217.0 lb

## 2016-10-08 DIAGNOSIS — S46811A Strain of other muscles, fascia and tendons at shoulder and upper arm level, right arm, initial encounter: Secondary | ICD-10-CM

## 2016-10-08 DIAGNOSIS — M19011 Primary osteoarthritis, right shoulder: Secondary | ICD-10-CM | POA: Diagnosis not present

## 2016-10-08 DIAGNOSIS — S4381XA Sprain of other specified parts of right shoulder girdle, initial encounter: Secondary | ICD-10-CM | POA: Diagnosis not present

## 2016-10-08 DIAGNOSIS — Z23 Encounter for immunization: Secondary | ICD-10-CM | POA: Diagnosis not present

## 2016-10-08 DIAGNOSIS — E1121 Type 2 diabetes mellitus with diabetic nephropathy: Secondary | ICD-10-CM

## 2016-10-08 LAB — POCT UA - MICROALBUMIN
CREATININE, POC: 50 mg/dL
MICROALBUMIN (UR) POC: 30 mg/L

## 2016-10-08 MED ORDER — LISINOPRIL 2.5 MG PO TABS: 2.5000 mg | ORAL_TABLET | Freq: Every day | ORAL | 0 refills | Status: DC

## 2016-10-08 MED ORDER — SAXAGLIPTIN-METFORMIN ER 2.5-1000 MG PO TB24: 1.0000 | ORAL_TABLET | Freq: Two times a day (BID) | ORAL | 0 refills | Status: DC

## 2016-10-08 NOTE — Patient Instructions (Signed)
Thank you for coming in today. Let me know if you do not get your diabetes medicine.  Start lisinopril daily.  Recheck in 1-3 months.     Diabetic Nephropathy Diabetic nephropathy is kidney disease that is caused by diabetes. Kidneys are the organs that filter and clean your blood. Kidneys also get rid of body waste products and extra fluid. Diabetes can cause gradual kidney damage over many years. Diabetic nephropathy that continues to get worse can lead to kidney failure. What are the causes? This condition is caused by kidney damage from diabetes. What increases the risk? This condition is more likely to develop in people with diabetes who also have:  High blood pressure.  High blood glucose.  A family history of diabetic nephropathy.  A history of diabetes for many years.  A history of tobacco use.  Certain inherited genes. What are the signs or symptoms? You may already have this condition before symptoms develop. Symptoms may include:  Swelling of your hands, feet, or ankles.  Weakness.  Poor appetite.  Nausea.  Confusion.  Fatigue.  Trouble sleeping. If nephropathy leads to kidney failure, symptoms may include:  Vomiting.  Shortness of breath.  Seizures.  Coma. How is this diagnosed? Usually, your health care provider can diagnose diabetic nephropathy from your symptoms and medical history. Your health care provider will also do a physical exam. It is important to diagnose this condition before symptoms develop so you may also have screening tests to look for any early signs of problems. These tests may include:  Yearly (annual) urine tests.  Urine collection over a 24-hour period to measure kidney function.  Blood tests that measure blood glucose levels and kidney function. Problems other than diabetes can damage kidneys. If screening tests show early kidney damage, but your health care provider suspects that it is caused by a different problem, you  may have other tests, such as:  An ultrasound of your kidneys.  A procedure to take a sample of kidney tissue for testing (biopsy). How is this treated? The goal of treatment is to prevent or slow down damage to your kidneys by managing your diabetes. To do this, it is important to control:  Your blood pressure. Your target blood pressure will be based on your age, the medicines you take, how long you have had diabetes, and any other medical conditions you have. Generally, the goal is to keep your blood pressure below 140/90. Medicines called ACE inhibitors may be used along with a water pill (diuretic) to help you control your blood pressure.  Your A1c (hemoglobin A1c, or HbA1c) level. This is a measurement of your average blood glucose level during the previous 2-3 months. You and your health care provider should work to keep this number under 7.  Your blood lipids. If you have high cholesterol, you may need to take lipid-lowering drugs, such as statins. Other treatments may include:  Medicines, including insulin injections.  Lifestyle changes, such as:  Losing weight.  Quitting smoking (smoking cessation).  A diet, which may include:  Restrictions of salt (sodium), protein, and fluid intake.  Vitamin D supplements. If your disease progresses to end-stage kidney failure, treatment may include:  Dialysis. This is a procedure to filter your blood with a machine.  Kidney transplant. Follow these instructions at home:  Take over-the-counter and prescription medicines only as told by your health care provider.  Follow instructions from your health care provider about eating or drinking restrictions.  Do not use tobacco  products, including cigarettes, chewing tobacco, and e-cigarettes. If you need help quitting, ask your health care provider.  Be physically active every day. Ask your health care provider what type of exercise is best for you.  Eat healthy foods, and eat healthy  snacks between meals. Have 3 meals and 3 snacks each day.  Maintain a healthy weight.  Keep all follow-up visits as told by your health care provider. This is important. Contact a health care provider if:  You have trouble keeping your blood glucose in your goal range.  Your hands, feet, or ankles are swollen.  You feel weak, tired, or dizzy.  You have muscle spasms.  You have nausea or vomiting.  You feel tired all the time. Get help right away if:  You are very sleepy.  You have:  A seizure or convulsion.  Severe, painful muscle spasms.  Shortness of breath.  Chest pain.  You pass out. This information is not intended to replace advice given to you by your health care provider. Make sure you discuss any questions you have with your health care provider. Document Released: 11/28/2007 Document Revised: 04/15/2016 Document Reviewed: 06/30/2015 Elsevier Interactive Patient Education  2017 [employer].

## 2016-10-08 NOTE — Progress Notes (Signed)
       Cristian Watkins is a 55 y.o. male who presents to [place] today for follow-up right shoulder pain and discuss diabetes.  Right shoulder pain: Ongoing. Patient has been seen by orthopedic surgery who recommended surgery. Surgery is scheduled in the near future. He notes continued pain and is unable to work or lift or push or pull with his right arm.  Diabetes: Patient was unable to get his Januvia and metformin combination pill due to insurance. Apparently there was some form his doctor needed to fill out that never happened. I was unaware of this. Otherwise he feels well with no polyuria polydipsia lightheadedness or dizziness.   Past Medical History:  Diagnosis Date  . Diabetes [facility])    Past Surgical History:  Procedure Laterality Date  . rotator cuff surgeries  [id number]   Social History  Substance Use Topics  . Smoking status: Never Smoker  . Smokeless tobacco: Not on file  . Alcohol use Yes   family history includes Prostate cancer in his father.  ROS as above:  Medications: Current Outpatient Prescriptions  Medication Sig Dispense Refill  . Saxagliptin-Metformin 2.03-999 MG TB24 Take 1 tablet by mouth 2 (two) times daily. 180 tablet 0  . lisinopril (PRINIVIL,ZESTRIL) 2.5 MG tablet Take 1 tablet (2.5 mg total) by mouth daily. 90 tablet 0   No current facility-administered medications for this visit.    No Known Allergies  Health Maintenance Health Maintenance  Topic Date Due  . PNEUMOCOCCAL POLYSACCHARIDE VACCINE (1) 05/29/1962  . FOOT EXAM  05/29/1970  . URINE MICROALBUMIN  05/29/1970  . COLONOSCOPY  05/29/2010  . OPHTHALMOLOGY EXAM  01/13/2017  . HEMOGLOBIN A1C  01/31/2017  . TETANUS/TDAP  07/09/2024  . INFLUENZA VACCINE  Completed  . Hepatitis C Screening  Completed  . HIV Screening  Completed     Exam:  BP 133/81   Pulse 96   Ht  5' 10"  (1.778 m)   Wt 217 lb (98.4 kg)   BMI 31.14 kg/m  Gen: Well NAD HEENT: EOMI,  MMM Lungs: Normal work of breathing. CTABL Heart: RRR no MRG Abd: NABS, Soft. Nondistended, Nontender Exts: Brisk capillary refill, warm and well perfused.  Right shoulder: Limitations in motion due to pain. Diabetic Foot Exam - Simple   Simple Foot Form Diabetic Foot exam was performed with the following findings:  Yes 10/08/2016 12:06 PM  Visual Inspection No deformities, no ulcerations, no other skin breakdown bilaterally:  Yes Sensation Testing Intact to touch and monofilament testing bilaterally:  Yes Pulse Check Posterior Tibialis and Dorsalis pulse intact bilaterally:  Yes Comments     Point of care urine microalbumin is abnormal  No results found for this or any previous visit (from the past 72 hour(s)). No results found.    Assessment and Plan: 55 y.o. male with  Shoulder pain: Agree to surgery. Insurance disability form filled out. Return as needed  Diabetes: We'll -send for Saxalgiptin and metformin with step edit info.   Patient also has diabetic nephropathy. We'll start low-dose lisinopril and recheck in 3 months or so.  Pneumonia 23 vaccine given prior to discharge.  Orders Placed This Encounter  Procedures  . Pneumococcal polysaccharide vaccine 23-valent greater than or equal to 2yo subcutaneous/IM    Discussed warning signs or symptoms. Please see discharge instructions. Patient expresses understanding.

## 2016-10-08 NOTE — Addendum Note (Signed)
Addended by: Darla Lesches T on: 10/08/2016 12:15 PM   Modules accepted: Orders

## 2016-11-01 ENCOUNTER — Encounter: Payer: 59 | Admitting: Family Medicine

## 2016-11-03 LAB — HEMOGLOBIN A1C: HEMOGLOBIN A1C: 10.7

## 2016-11-04 ENCOUNTER — Telehealth: Payer: Self-pay | Admitting: Family Medicine

## 2016-11-04 NOTE — Telephone Encounter (Signed)
Note from orthopedics stating A1c is 10.7. Has noticed a for surgery. I left a message asking patient to call back or come in

## 2016-11-08 ENCOUNTER — Ambulatory Visit (INDEPENDENT_AMBULATORY_CARE_PROVIDER_SITE_OTHER): Payer: 59 | Admitting: Family Medicine

## 2016-11-08 ENCOUNTER — Encounter: Payer: Self-pay | Admitting: Family Medicine

## 2016-11-08 VITALS — BP 135/77 | HR 85 | Wt 219.0 lb

## 2016-11-08 DIAGNOSIS — IMO0002 Reserved for concepts with insufficient information to code with codable children: Secondary | ICD-10-CM

## 2016-11-08 DIAGNOSIS — E1165 Type 2 diabetes mellitus with hyperglycemia: Secondary | ICD-10-CM

## 2016-11-08 DIAGNOSIS — E1121 Type 2 diabetes mellitus with diabetic nephropathy: Secondary | ICD-10-CM

## 2016-11-08 LAB — HEMOGLOBIN A1C: A1c: 10.7

## 2016-11-08 MED ORDER — AMBULATORY NON FORMULARY MEDICATION: 0 refills | Status: DC

## 2016-11-08 NOTE — Patient Instructions (Signed)
Thank you for coming in today. Test sugar twice daily (once fasting and once after meals).  Send results through mychart in 2 weeks.  We may start [place].    Dapagliflozin tablets What is this medicine? DAPAGLIFLOZIN (DAP a gli FLOE zin) helps to treat type 2 diabetes. It helps to control blood sugar. Treatment is combined with diet and exercise. COMMON BRAND NAME(S): Wilder Glade What should I tell my health care provider before I take this medicine? They need to know if you have any of these conditions: -bladder cancer -dehydration -diabetic ketoacidosis -diet low in salt -eating less due to illness, surgery, dieting, or any other reason -having surgery -high cholesterol -history of pancreatitis or pancreas problems -history of yeast infection of the penis or vagina -if you often drink alcohol -infections in the bladder, kidneys, or urinary tract -kidney disease -low blood pressure -on hemodialysis -problems urinating -type 1 diabetes -uncircumcised male -an unusual or allergic reaction to dapagliflozin, other medicines, foods, dyes, or preservatives -pregnant or trying to get pregnant -breast-feeding How should I use this medicine? Take this medicine by mouth with a glass of water. Follow the directions on the prescription label. You can take it with or without food. If it upsets your stomach, take it with food. Take this medicine in the morning. Take your dose at the same time each day. Do not take more often than directed. Do not stop taking except on your doctor's advice. A special MedGuide will be given to you by the pharmacist with each prescription and refill. Be sure to read this information carefully each time. Talk to your pediatrician regarding the use of this medicine in children. Special care may be needed. What if I miss a dose? If you miss a dose, take it as soon as you can. If it is almost time for your next dose, take only that dose. Do not take double or extra  doses. What may interact with this medicine? Do not take this medicine with any of the following medications: -gatifloxacinThis medicine may also interact with the following medications: -alcohol -certain medicines for blood pressure, heart disease -diuretics -insulin -nateglinide -pioglitazone -quinolone antibiotics like ciprofloxacin, levofloxacin, ofloxacin -repaglinide -some herbal dietary supplements -steroid medicines like prednisone or cortisone -sulfonylureas like glimepiride, glipizide, glyburide -thyroid medicine What should I watch for while using this medicine? Visit your doctor or health care professional for regular checks on your progress. This medicine can cause a serious condition in which there is too much acid in the blood. If you develop nausea, vomiting, stomach pain, unusual tiredness, or breathing problems, stop taking this medicine and call your doctor right away. If possible, use a ketone dipstick to check for ketones in your urine. A test called the HbA1C (A1C) will be monitored. This is a simple blood test. It measures your blood sugar control over the last 2 to 3 months. You will receive this test every 3 to 6 months. Learn how to check your blood sugar. Learn the symptoms of low and high blood sugar and how to manage them. Always carry a quick-source of sugar with you in case you have symptoms of low blood sugar. Examples include hard sugar candy or glucose tablets. Make sure others know that you can choke if you eat or drink when you develop serious symptoms of low blood sugar, such as seizures or unconsciousness. They must get medical help at once. Tell your doctor or health care professional if you have high blood sugar. You might need to  change the dose of your medicine. If you are sick or exercising more than usual, you might need to change the dose of your medicine. Do not skip meals. Ask your doctor or health care professional if you should avoid alcohol.  Many nonprescription cough and cold products contain sugar or alcohol. These can affect blood sugar. Wear a medical ID bracelet or chain, and carry a card that describes your disease and details of your medicine and dosage times. What side effects may I notice from receiving this medicine? Side effects that you should report to your doctor or health care professional as soon as possible: -allergic reactions like skin rash, itching or hives, swelling of the face, lips, or tongue -breathing problems -dizziness -feeling faint or lightheaded, falls -muscle weakness -nausea, vomiting, unusual stomach upset or pain -signs and symptoms of low blood sugar such as feeling anxious, confusion, dizziness, increased hunger, unusually weak or tired, sweating, shakiness, cold, irritable, headache, blurred vision, fast heartbeat, loss of consciousness -signs and symptoms of a urinary tract infection, such as fever, chills, a burning feeling when urinating, blood in the urine, back pain -trouble passing urine or change in the amount of urine, including an urgent need to urinate more often, in larger amounts, or at night -penile discharge, itching, or pain in men -unusual tiredness -vaginal discharge, itching, or odor in women Side effects that usually do not require medical attention (report to your doctor or health care professional if they continue or are bothersome): -constipation -mild increase in urination -stuffy or runny nose -sore throat -thirsty Where should I keep my medicine? Keep out of the reach of children. Store at room temperature between 15 and 30 degrees C (59 and 86 degrees F). Throw away any unused medicine after the expiration date.  2017 Elsevier/Gold Standard (2015-12-11 08:46:40)

## 2016-11-08 NOTE — Progress Notes (Signed)
       Cristian Watkins is a 55 y.o. male who presents to [place]: [place] today for follow up diabetes.   Cristian Watkins was scheduled to have orthopedic surgery this month however his A1c was checked and found to be 10.7. His surgery was discontinued until his A1c is less than 8. He notes that up until about a month ago he had been out of his Janumet and has only been taking it for months. He does not take his blood sugars regularly. He denies polyuria or polydipsia. Since he was informed that his blood sugar was out of control he has been improving his diet and lifestyle. He has cut out carbs and is exercising daily.   Past Medical History:  Diagnosis Date  . Diabetes [facility])    Past Surgical History:  Procedure Laterality Date  . rotator cuff surgeries  [id number]   Social History  Substance Use Topics  . Smoking status: Never Smoker  . Smokeless tobacco: Not on file  . Alcohol use Yes   family history includes Prostate cancer in his father.  ROS as above:  Medications: Current Outpatient Prescriptions  Medication Sig Dispense Refill  . lisinopril (PRINIVIL,ZESTRIL) 2.5 MG tablet Take 1 tablet (2.5 mg total) by mouth daily. 90 tablet 0  . Saxagliptin-Metformin 2.03-999 MG TB24 Take 1 tablet by mouth 2 (two) times daily. 180 tablet 0  . AMBULATORY NON FORMULARY MEDICATION lancets, test strips of choice for testing 2x daily E11.29 Uncontrolled diabetes 1 each 0   No current facility-administered medications for this visit.    No Known Allergies  Health Maintenance Health Maintenance  Topic Date Due  . COLONOSCOPY  05/29/2010  . OPHTHALMOLOGY EXAM  01/13/2017  . HEMOGLOBIN A1C  01/31/2017  . FOOT EXAM  10/08/2017  . PNEUMOCOCCAL POLYSACCHARIDE VACCINE (2) 10/08/2021  . TETANUS/TDAP  07/09/2024  . INFLUENZA VACCINE  Completed  . Hepatitis C Screening  Completed  . HIV  Screening  Completed     Exam:  BP 135/77   Pulse 85   Wt 219 lb (99.3 kg)   BMI 31.42 kg/m  Gen: Well NAD HEENT: EOMI,  MMM Lungs: Normal work of breathing. CTABL Heart: RRR no MRG Abd: NABS, Soft. Nondistended, Nontender Exts: Brisk capillary refill, warm and well perfused.     No results found for this or any previous visit (from the past 72 hour(s)). No results found.    Assessment and Plan: 55 y.o. male with  Uncontrolled type 2 diabetes with hemoglobin A1c of 10.7. This is unfortunate as patient has not been on his medicines until about a month ago. I'm not sure how accurately the current A1c reflects the current blood sugar control. Plan to obtain blood sugar log with him checking twice daily for the next 2 weeks.  If blood sugars are out of range we'll add SGLT2 type medicine (likely [place]).   Will contact patient following blood sugar logs to come up with plan.   No orders of the defined types were placed in this encounter.   Discussed warning signs or symptoms. Please see discharge instructions. Patient expresses understanding.

## 2016-11-12 ENCOUNTER — Encounter: Payer: Self-pay | Admitting: Family Medicine

## 2016-11-22 ENCOUNTER — Encounter: Payer: Self-pay | Admitting: Family Medicine

## 2016-11-23 MED ORDER — DAPAGLIFLOZIN PROPANEDIOL 10 MG PO TABS: 10.0000 mg | ORAL_TABLET | Freq: Every day | ORAL | 6 refills | Status: DC

## 2016-11-24 ENCOUNTER — Encounter: Payer: Self-pay | Admitting: Family Medicine

## 2016-11-25 ENCOUNTER — Encounter: Payer: Self-pay | Admitting: Family Medicine

## 2016-11-25 MED ORDER — CANAGLIFLOZIN 100 MG PO TABS: 100.0000 mg | ORAL_TABLET | Freq: Every day | ORAL | 1 refills | Status: DC

## 2016-11-26 ENCOUNTER — Telehealth: Payer: Self-pay | Admitting: *Deleted

## 2016-11-26 NOTE — Telephone Encounter (Signed)
Invokana approved through insurance.Approvedtoday  Request Reference Number: [id number]. INVOKANA TAB 100MG is approved through 11/26/2017. For further questions, call [phone].   Patient and pharmacy notified

## 2016-11-30 ENCOUNTER — Emergency Department (INDEPENDENT_AMBULATORY_CARE_PROVIDER_SITE_OTHER)
Admission: EM | Admit: 2016-11-30 | Discharge: 2016-11-30 | Disposition: A | Discharge status: Home / Self Care | Payer: 59 | Source: Home / Self Care | Attending: Family Medicine | Admitting: Family Medicine

## 2016-11-30 ENCOUNTER — Encounter: Payer: Self-pay | Admitting: Family Medicine

## 2016-11-30 ENCOUNTER — Encounter: Payer: Self-pay | Admitting: *Deleted

## 2016-11-30 DIAGNOSIS — K920 Hematemesis: Secondary | ICD-10-CM | POA: Diagnosis not present

## 2016-11-30 DIAGNOSIS — R000 Tachycardia, unspecified: Secondary | ICD-10-CM

## 2016-11-30 MED ORDER — ONDANSETRON 4 MG PO TBDP
4.0000 mg | ORAL_TABLET | Freq: Once | ORAL | Status: AC
  Administered 2016-11-30: 4 mg via ORAL

## 2016-11-30 NOTE — ED Provider Notes (Signed)
CSN: [id number]     Arrival date & time 11/30/16  1640 History   First MD Initiated Contact with Patient 11/30/16 1729     Chief Complaint  Patient presents with  . Emesis   (Consider location/radiation/quality/duration/timing/severity/associated sxs/prior Treatment) HPI  Cristian Watkins is a 56 y.o. male presenting to UC with c/o multiple bouts of vomiting this morning about 10 days with dark brown that turned into a lighter brown color after drinking milk, "like chocolate milk."  He also started to have heartburn last night which is unusual for him.  He has a hx of diabetes and was recently started on Invokana. Pt unsure if that is what caused his current symptoms. No prior hx of gastric ulcers or GI bleeds.  He has not had a BM in a few days but does not recall seeing blood in his stool.  He has mild upper abdominal pain and LLQ abdominal pain. No fever or chills. His PCP is out of town this week.    Past Medical History:  Diagnosis Date  . Diabetes [facility])    Past Surgical History:  Procedure Laterality Date  . rotator cuff surgeries  [id number]   Family History  Problem Relation Age of Onset  . Prostate cancer Father    Social History  Substance Use Topics  . Smoking status: Never Smoker  . Smokeless tobacco: Never Used  . Alcohol use Yes    Review of Systems  Constitutional: Negative for chills and fever.  Respiratory: Negative for cough and shortness of breath.   Cardiovascular: Negative for chest pain and palpitations.  Gastrointestinal: Positive for abdominal pain, nausea and vomiting (dark brown). Negative for diarrhea.  Genitourinary: Negative for dysuria, flank pain, frequency and hematuria.    Allergies  Patient has no known allergies.  Home Medications   Prior to Admission medications   Medication Sig Start Date End Date Taking? Authorizing Provider  AMBULATORY NON FORMULARY MEDICATION lancets, test strips of choice for testing 2x daily E11.29 Uncontrolled  diabetes 11/08/16   Gregor Hams, MD  canagliflozin [facility]) 100 MG TABS tablet Take 1 tablet (100 mg total) by mouth daily before breakfast. 11/25/16   Gregor Hams, MD  lisinopril (PRINIVIL,ZESTRIL) 2.5 MG tablet Take 1 tablet (2.5 mg total) by mouth daily. 10/08/16   Gregor Hams, MD  Saxagliptin-Metformin 2.03-999 MG TB24 Take 1 tablet by mouth 2 (two) times daily. 10/08/16   Gregor Hams, MD   Meds Ordered and Administered this Visit  Medications - No data to display  BP 112/79 (BP Location: Left Arm)   Pulse 114   Temp 97.5 F (36.4 C) (Oral)   Wt 206 lb (93.4 kg)   SpO2 96%   BMI 29.56 kg/m  No data found.   Physical Exam  Constitutional: He is oriented to person, place, and time. He appears well-developed and well-nourished. No distress.  HENT:  Head: Normocephalic and atraumatic.  Mouth/Throat: Oropharynx is clear and moist.  Eyes: EOM are normal.  Neck: Normal range of motion.  Cardiovascular: Regular rhythm.  Tachycardia present.   Mild tachycardia  Pulmonary/Chest: Effort normal and breath sounds normal. No respiratory distress. He has no wheezes. He has no rales.  Abdominal: Soft. He exhibits no distension and no mass. There is tenderness ( upper abdomen and LLQ). There is no rebound and no guarding.  Musculoskeletal: Normal range of motion.  Neurological: He is alert and oriented to person, place, and time.  Skin: Skin is warm and  dry. He is not diaphoretic.  Psychiatric: He has a normal mood and affect. His behavior is normal.  Nursing note and vitals reviewed.   Urgent Care Course   Clinical Course     Procedures (including critical care time)  Labs Review Labs Reviewed - No data to display  Imaging Review No results found.    MDM   1. Hematemesis with nausea   2. Tachycardia    Pt c/o multiple episodes of dark brown emesis today. Upper abdominal pain, LLQ pain as well.  Concern for GI bleed. Recommend pt go to ED for further evaluation and  treatment. Pt is mildly tachycardic but otherwise stable and feels comfortable driving himself to ED. Pt plans to go to [place], [place] 11/30/16 1751

## 2016-11-30 NOTE — ED Triage Notes (Addendum)
Patient c/o multiple bouts of vomiting x this AM. Vomit was dark brown at first and since lightened. Also c/o heartburn. CBG 114 earlier today.

## 2016-12-01 ENCOUNTER — Ambulatory Visit: Payer: 59 | Admitting: Physician Assistant

## 2016-12-05 ENCOUNTER — Other Ambulatory Visit: Payer: Self-pay | Admitting: Family Medicine

## 2016-12-07 ENCOUNTER — Encounter: Payer: Self-pay | Admitting: Family Medicine

## 2016-12-07 ENCOUNTER — Ambulatory Visit (INDEPENDENT_AMBULATORY_CARE_PROVIDER_SITE_OTHER): Payer: 59 | Admitting: Family Medicine

## 2016-12-07 VITALS — BP 120/80 | HR 96 | Wt 212.0 lb

## 2016-12-07 DIAGNOSIS — K209 Esophagitis, unspecified without bleeding: Secondary | ICD-10-CM

## 2016-12-07 DIAGNOSIS — E1121 Type 2 diabetes mellitus with diabetic nephropathy: Secondary | ICD-10-CM

## 2016-12-07 DIAGNOSIS — IMO0002 Reserved for concepts with insufficient information to code with codable children: Secondary | ICD-10-CM

## 2016-12-07 DIAGNOSIS — R160 Hepatomegaly, not elsewhere classified: Secondary | ICD-10-CM | POA: Diagnosis not present

## 2016-12-07 DIAGNOSIS — N281 Cyst of kidney, acquired: Secondary | ICD-10-CM | POA: Diagnosis not present

## 2016-12-07 DIAGNOSIS — E1165 Type 2 diabetes mellitus with hyperglycemia: Secondary | ICD-10-CM

## 2016-12-07 MED ORDER — OMEPRAZOLE 40 MG PO CPDR: 40.0000 mg | DELAYED_RELEASE_CAPSULE | Freq: Every day | ORAL | 3 refills | Status: DC

## 2016-12-07 NOTE — Patient Instructions (Addendum)
Thank you for coming in today. Get labs today and schedule ultrasound soon.  Continue omeprazole daily for 1 month.  If your belly pain worsens, or you have high fever, bad vomiting, blood in your stool or black tarry stool go to the Emergency Room.   Recheck in 1 month or sooner if needed.   We should repeat the CT scan in 6 months unless we learn something else on the ultrasound soon.    Fatty Liver Introduction Fatty liver, also called hepatic steatosis or steatohepatitis, is a condition in which too much fat has built up in your liver cells. The liver removes harmful substances from your bloodstream. It produces fluids your body needs. It also helps your body use and store energy from the food you eat. In many cases, fatty liver does not cause symptoms or problems. It is often diagnosed when tests are being done for other reasons. However, over time, fatty liver can cause inflammation that may lead to more serious liver problems, such as scarring of the liver (cirrhosis). What are the causes? Causes of fatty liver may include:  Drinking too much alcohol.  Poor nutrition.  Obesity.  Cushing syndrome.  Diabetes.  Hyperlipidemia.  Pregnancy.  Certain drugs.  Poisons.  Some viral infections. What increases the risk? You may be more likely to develop fatty liver if you:  Abuse alcohol.  Are pregnant.  Are overweight.  Have diabetes.  Have hepatitis.  Have a high triglyceride level. What are the signs or symptoms? Fatty liver often does not cause any symptoms. In cases where symptoms develop, they can include:  Fatigue.  Weakness.  Weight loss.  Confusion.  Abdominal pain.  Yellowing of your skin and the white parts of your eyes (jaundice).  Nausea and vomiting. How is this diagnosed? Fatty liver may be diagnosed by:  Physical exam and medical history.  Blood tests.  Imaging tests, such as an ultrasound, CT scan, or MRI.  Liver biopsy. A small  sample of liver tissue is removed using a needle. The sample is then looked at under a microscope. How is this treated? Fatty liver is often caused by other health conditions. Treatment for fatty liver may involve medicines and lifestyle changes to manage conditions such as:  Alcoholism.  High cholesterol.  Diabetes.  Being overweight or obese. Follow these instructions at home:  Eat a healthy diet as directed by your health care provider.  Exercise regularly. This can help you lose weight and control your cholesterol and diabetes. Talk to your health care provider about an exercise plan and which activities are best for you.  Do not drink alcohol.  Take medicines only as directed by your health care provider. Contact a health care provider if: You have difficulty controlling your:  Blood sugar.  Cholesterol.  Alcohol consumption. Get help right away if:  You have abdominal pain.  You have jaundice.  You have nausea and vomiting. This information is not intended to replace advice given to you by your health care provider. Make sure you discuss any questions you have with your health care provider. Document Released: 12/24/2005 Document Revised: 04/15/2016 Document Reviewed: 03/20/2014  2017 Elsevier

## 2016-12-07 NOTE — Progress Notes (Signed)
Cristian Watkins is a 56 y.o. male who presents to [place]: [place] today for follow-up of recent emergency room visit. Patient was seen in the emergency room on January 9 for vomiting. He was thought to have hematemesis and had an extensive workup that showed on CT scan hepatomegaly with steatohepatitis pattern as well as small left-sided renal cysts.  Labs were remarkable for elevated white blood cell count per were otherwise relatively normal. He was given IV fluids and antibiotics. He's feeling a lot better today with no abdominal pain fevers or chills. He does still have some throat pain and mild indigestion. He's been taking omeprazole intermittently since his hospitalization.    Past Medical History:  Diagnosis Date  . Diabetes [facility])    Past Surgical History:  Procedure Laterality Date  . rotator cuff surgeries  1829,9371   Social History  Substance Use Topics  . Smoking status: Never Smoker  . Smokeless tobacco: Never Used  . Alcohol use Yes   family history includes Prostate cancer in his father.  ROS as above:  Medications: Current Outpatient Prescriptions  Medication Sig Dispense Refill  . AMBULATORY NON FORMULARY MEDICATION lancets, test strips of choice for testing 2x daily E11.29 Uncontrolled diabetes 1 each 0  . canagliflozin (INVOKANA) 100 MG TABS tablet Take 1 tablet (100 mg total) by mouth daily before breakfast. 90 tablet 1  . lisinopril (PRINIVIL,ZESTRIL) 2.5 MG tablet Take 1 tablet (2.5 mg total) by mouth daily. 90 tablet 0  . ONE TOUCH ULTRA TEST test strip USE TO TEST BLOOD SUGAR TWICE A DAY 100 each 2  . Saxagliptin-Metformin 2.03-999 MG TB24 Take 1 tablet by mouth 2 (two) times daily. 180 tablet 0  . omeprazole (PRILOSEC) 40 MG capsule Take 1 capsule (40 mg total) by mouth daily. 30 capsule 3   No current facility-administered medications for  this visit.    No Known Allergies  Health Maintenance Health Maintenance  Topic Date Due  . COLONOSCOPY  05/29/2010  . OPHTHALMOLOGY EXAM  01/13/2017  . HEMOGLOBIN A1C  05/09/2017  . FOOT EXAM  10/08/2017  . PNEUMOCOCCAL POLYSACCHARIDE VACCINE (2) 10/08/2021  . TETANUS/TDAP  07/09/2024  . INFLUENZA VACCINE  Completed  . Hepatitis C Screening  Completed  . HIV Screening  Completed     Exam:  BP 120/80   Pulse 96   Wt 212 lb (96.2 kg)   BMI 30.42 kg/m  Gen: Well NAD HEENT: EOMI,  MMM Lungs: Normal work of breathing. CTABL Heart: RRR no MRG Abd: NABS, Soft. Nondistended, NontenderNo masses palpated  Exts: Brisk capillary refill, warm and well perfused.   CT Abdomen Pelvis W Contrast1/07/2017 [facility] Result Impression  IMPRESSION: 1.No acute CT findings in the abdomen or pelvis.  2.Mild thickening of the distal esophagus reflux/esophagitis.  3.Hepatomegaly with severe hepatic steatosis. 4.Colonic diverticulosis acute findings. 5.Dominant fluid attenuating lesions in the left kidney likely relate to cysts. Subcentimeter hypodense renal lesions are too small to accurately characterize.  Result Narrative  INDICATION: Vomiting. Leukocytosis. Diabetes. Epigastric Pain   COMPARISON:CT dated 08/31/2007.  TECHNIQUE:CT ABDOMEN PELVIS W CONTRAST - 80 mLIsovue 370 were administered intravenously. Dose reduction was utilized (automated exposure control, mA or kV adjustment based on patient size, or iterative image reconstruction).  FINDINGS:   LOWER CHEST: - Mediastinum: Mild thickening of the distal esophagus.  - Heart: Visualized portion is unremarkable. No evidence of pericardial effusion. - Lung bases: No focal pulmonary opacities. -  Pleura: Within normal limits.  ABDOMEN: - Liver: Severe hepatic steatosis with focal fatty sparing along the gallbladder fossa. Hepatomegaly, the liver measures 20.4 cm. - Gallbladder/biliary system: Within normal  limits. - Spleen: Within normal limits. - Pancreas: Within normal limits. - Adrenal glands: 1 cm immediate attenuating nodule in the right adrenal gland. This is unchanged relative to thousand 8 which is reassuring. - Kidneys: Mild bilateral perinephric stranding. 1.5 cm fluid attenuating lesion in the upper pole left kidney likely represents cyst. Subcentimeter hypodense renal lesions are too small to accurately characterize. No hydronephrosis. - GI tract: No bowel obstruction. Colonic diverticulosis without evidence of acute diverticulitis. Nonspecific fluid-filled proximal jejunum.  - Appendix: Unremarkable.  - Peritoneum/retroperitoneum: No free fluid. No pneumoperitoneum. Small mildly prominent upper abdominal lymph nodes are nonspecific. - Vessels: Scattered atherosclerosis.Marland Kitchen  PELVIS: - Bladder: Within normal limits. - Ureters: Within normal limits. - Reproductive: Mild prostatomegaly.  MUSCULOSKELETAL: - No acute osseous findings. Mild pubic symphysis and sacroiliac joint degenerative changes. Mild multilevel degenerative changes of the spine.  Status     No results found for this or any previous visit (from the past 72 hour(s)). No results found.    Assessment and Plan: 56 y.o. male with  Hepatomegaly with steatohepatitis: Unclear etiology. Patient does not have significant liver enzyme elevation on previous labs. Plan to obtain an abdominal ultrasound as well as hepatitis evaluation listed below in the near future.  Renal cysts: Incidentally noted. Patient is a symptomatic from this perspective. This will be reevaluated with labs as below as well as with abdominal ultrasound. We may want to repeat CT scan in 6 months  Esophagitis on CT scan: Recommend continued omeprazole for about a month.  Diabetes: Recommend resuming Invokana.  Recheck in 1-2 months.  Orders Placed This Encounter  Procedures  . US Abdomen Complete    Standing Status:   Future    Standing  Expiration Date:   02/04/2018    Order Specific Question:   Reason for Exam (SYMPTOM  OR DIAGNOSIS REQUIRED)    Answer:   Hepatomegaly and stetohepatitis and left kidney cyst on CT scan 11/30/16 at [place] Specific Question:   Preferred imaging location?    Answer:   [facility]  . CBC  . COMPLETE METABOLIC PANEL WITH GFR  . Lipase  . Acute Hep Panel & Hep B Surface Ab    Discussed warning signs or symptoms. Please see discharge instructions. Patient expresses understanding.

## 2016-12-14 ENCOUNTER — Ambulatory Visit (INDEPENDENT_AMBULATORY_CARE_PROVIDER_SITE_OTHER): Payer: 59

## 2016-12-14 DIAGNOSIS — N281 Cyst of kidney, acquired: Secondary | ICD-10-CM | POA: Diagnosis not present

## 2016-12-14 DIAGNOSIS — R160 Hepatomegaly, not elsewhere classified: Secondary | ICD-10-CM

## 2016-12-15 ENCOUNTER — Encounter: Payer: Self-pay | Admitting: Family Medicine

## 2016-12-15 DIAGNOSIS — K7581 Nonalcoholic steatohepatitis (NASH): Secondary | ICD-10-CM | POA: Insufficient documentation

## 2016-12-16 ENCOUNTER — Encounter: Payer: Self-pay | Admitting: Family Medicine

## 2016-12-20 LAB — BASIC METABOLIC PANEL
BUN: 13 mg/dL (ref 4–21)
Creatinine: 0.9 mg/dL (ref 0.6–1.3)
GLUCOSE: 120 mg/dL
Potassium: 4.5 mmol/L (ref 3.4–5.3)
Sodium: 140 mmol/L (ref 137–147)

## 2016-12-20 LAB — CBC AND DIFFERENTIAL
HEMOGLOBIN: 15.3 g/dL (ref 13.5–17.5)
PLATELETS: 220 10*3/uL (ref 150–399)
WBC: 5.3 10^3/mL

## 2016-12-20 LAB — HEPATITIS PANEL, ACUTE
HEP A IGM: NEGATIVE
HEP B C IGM: NEGATIVE
HEPATITIS B SURFACE ANTIGEN: NEGATIVE

## 2016-12-20 LAB — HEPATIC FUNCTION PANEL
ALT: 75 U/L — AB (ref 10–40)
AST: 37 U/L (ref 14–40)
Alkaline Phosphatase: 55 U/L (ref 25–125)
BILIRUBIN, TOTAL: 0.4 mg/dL

## 2016-12-22 ENCOUNTER — Encounter: Payer: Self-pay | Admitting: Family Medicine

## 2016-12-24 ENCOUNTER — Telehealth: Payer: Self-pay | Admitting: Family Medicine

## 2016-12-24 NOTE — Telephone Encounter (Signed)
Cristian Watkins needs his RETURN TO WORK FORM faxed to his short term disability ([employer]) and his ID # is [id number] which will be needed on the form or the fax cover sheet. FAX # [phone]

## 2016-12-24 NOTE — Telephone Encounter (Addendum)
Was unable to located the form.

## 2016-12-28 NOTE — Telephone Encounter (Signed)
Did pt bring a form by?

## 2016-12-29 ENCOUNTER — Telehealth: Payer: Self-pay | Admitting: Family Medicine

## 2016-12-29 NOTE — Telephone Encounter (Signed)
Pt notified. He would like to know if there is a plan regarding addressing liver inflammation.

## 2016-12-29 NOTE — Telephone Encounter (Signed)
Received labs from [place].   CBC: Is normal. This tests white blood cells and for anemia.   CMP: Is basically normal. Sugar is a bit up but much better controlled than previously.  There is mild liver inflammation present likely due to diabetes.   Viral Hepatitis tests are normal   Lipase test is normal. No pancreatitis is present.

## 2016-12-29 NOTE — Telephone Encounter (Signed)
Letter faxed.

## 2016-12-30 NOTE — Telephone Encounter (Signed)
Information discussed with pt. Pt verbalized understanding, FU appt scheduled for 01/03/17.

## 2016-12-30 NOTE — Telephone Encounter (Signed)
The liver tests match the ultrasound that we did in [place] and the CT scan that was done in October. I think this is all due to diabetes not being well controlled. I would like to have an appointment were we can sit down and go over all the information we have gathered the last month or 2.  We can come up with a plan on how much further to work up the liver inflammation and the kidney cysts.

## 2017-01-02 ENCOUNTER — Other Ambulatory Visit: Payer: Self-pay | Admitting: Family Medicine

## 2017-01-03 ENCOUNTER — Ambulatory Visit (INDEPENDENT_AMBULATORY_CARE_PROVIDER_SITE_OTHER): Payer: 59 | Admitting: Family Medicine

## 2017-01-03 ENCOUNTER — Other Ambulatory Visit: Payer: Self-pay | Admitting: Family Medicine

## 2017-01-03 ENCOUNTER — Encounter: Payer: Self-pay | Admitting: Family Medicine

## 2017-01-03 VITALS — BP 119/73 | HR 98 | Wt 214.0 lb

## 2017-01-03 DIAGNOSIS — E1121 Type 2 diabetes mellitus with diabetic nephropathy: Secondary | ICD-10-CM

## 2017-01-03 DIAGNOSIS — K7581 Nonalcoholic steatohepatitis (NASH): Secondary | ICD-10-CM

## 2017-01-03 DIAGNOSIS — N281 Cyst of kidney, acquired: Secondary | ICD-10-CM | POA: Diagnosis not present

## 2017-01-03 DIAGNOSIS — IMO0002 Reserved for concepts with insufficient information to code with codable children: Secondary | ICD-10-CM

## 2017-01-03 DIAGNOSIS — E1165 Type 2 diabetes mellitus with hyperglycemia: Secondary | ICD-10-CM

## 2017-01-03 DIAGNOSIS — M19011 Primary osteoarthritis, right shoulder: Secondary | ICD-10-CM | POA: Diagnosis not present

## 2017-01-03 NOTE — Patient Instructions (Addendum)
Thank you for coming in today. We will refer to Dr Latanya Maudlin for late March.  Please return for a nurse visit A1c check anytime after March 12th.  Return sooner if needed.    We will want to repeat the ultrasound of the abdomen in about 3 months.     Fatty Liver Introduction Fatty liver, also called hepatic steatosis or steatohepatitis, is a condition in which too much fat has built up in your liver cells. The liver removes harmful substances from your bloodstream. It produces fluids your body needs. It also helps your body use and store energy from the food you eat. In many cases, fatty liver does not cause symptoms or problems. It is often diagnosed when tests are being done for other reasons. However, over time, fatty liver can cause inflammation that may lead to more serious liver problems, such as scarring of the liver (cirrhosis). What are the causes? Causes of fatty liver may include:  Drinking too much alcohol.  Poor nutrition.  Obesity.  Cushing syndrome.  Diabetes.  Hyperlipidemia.  Pregnancy.  Certain drugs.  Poisons.  Some viral infections. What increases the risk? You may be more likely to develop fatty liver if you:  Abuse alcohol.  Are pregnant.  Are overweight.  Have diabetes.  Have hepatitis.  Have a high triglyceride level. What are the signs or symptoms? Fatty liver often does not cause any symptoms. In cases where symptoms develop, they can include:  Fatigue.  Weakness.  Weight loss.  Confusion.  Abdominal pain.  Yellowing of your skin and the white parts of your eyes (jaundice).  Nausea and vomiting. How is this diagnosed? Fatty liver may be diagnosed by:  Physical exam and medical history.  Blood tests.  Imaging tests, such as an ultrasound, CT scan, or MRI.  Liver biopsy. A small sample of liver tissue is removed using a needle. The sample is then looked at under a microscope. How is this treated? Fatty liver is often  caused by other health conditions. Treatment for fatty liver may involve medicines and lifestyle changes to manage conditions such as:  Alcoholism.  High cholesterol.  Diabetes.  Being overweight or obese. Follow these instructions at home:  Eat a healthy diet as directed by your health care provider.  Exercise regularly. This can help you lose weight and control your cholesterol and diabetes. Talk to your health care provider about an exercise plan and which activities are best for you.  Do not drink alcohol.  Take medicines only as directed by your health care provider. Contact a health care provider if: You have difficulty controlling your:  Blood sugar.  Cholesterol.  Alcohol consumption. Get help right away if:  You have abdominal pain.  You have jaundice.  You have nausea and vomiting. This information is not intended to replace advice given to you by your health care provider. Make sure you discuss any questions you have with your health care provider. Document Released: 12/24/2005 Document Revised: 04/15/2016 Document Reviewed: 03/20/2014  2017 [employer]vier

## 2017-01-03 NOTE — Progress Notes (Signed)
Cristian Watkins is a 56 y.o. male who presents to [place]: [place] today for follow-up diabetes, transaminitis, kidney cyst.  Diabetes: Patient is doing well with the medications listed below. His typical blood sugars around 130. He denies any hypoglycemia episodes above 200 any hypoglycemia episodes.  His last A1c December 11 was around 10.  Transaminitis: Patient had a CT abdomen scan an outside hospital and had evidence of steatohepatitis and hepatomegaly. He had follow-up labs in the interim done at [facility].  Additionally he had an abdominal ultrasound. He denies any abdominal pain and feels well otherwise.  Kidney Cyst: Cristian Watkins had a small kidney cyst seen on CT scan. His follow up US confirmed a small complex cyst. He denies any significant hematuria or flank pain, fever, chills, night sweats.     Past Medical History:  Diagnosis Date  . Diabetes [facility])    Past Surgical History:  Procedure Laterality Date  . rotator cuff surgeries  8099,8338   Social History  Substance Use Topics  . Smoking status: Never Smoker  . Smokeless tobacco: Never Used  . Alcohol use Yes   family history includes Prostate cancer in his father.  ROS as above:  Medications: Current Outpatient Prescriptions  Medication Sig Dispense Refill  . AMBULATORY NON FORMULARY MEDICATION lancets, test strips of choice for testing 2x daily E11.29 Uncontrolled diabetes 1 each 0  . canagliflozin (INVOKANA) 100 MG TABS tablet Take 1 tablet (100 mg total) by mouth daily before breakfast. 90 tablet 1  . lisinopril (PRINIVIL,ZESTRIL) 2.5 MG tablet Take 1 tablet (2.5 mg total) by mouth daily. 90 tablet 0  . omeprazole (PRILOSEC) 40 MG capsule Take 1 capsule (40 mg total) by mouth daily. 30 capsule 3  . ONE TOUCH ULTRA TEST test strip USE TO TEST BLOOD SUGAR TWICE A DAY 100 each 2  .  Saxagliptin-Metformin 2.03-999 MG TB24 Take 1 tablet by mouth 2 (two) times daily. 180 tablet 0   No current facility-administered medications for this visit.    No Known Allergies  Health Maintenance Health Maintenance  Topic Date Due  . COLONOSCOPY  05/29/2010  . OPHTHALMOLOGY EXAM  01/13/2017  . HEMOGLOBIN A1C  05/09/2017  . FOOT EXAM  10/08/2017  . PNEUMOCOCCAL POLYSACCHARIDE VACCINE (2) 10/08/2021  . TETANUS/TDAP  07/09/2024  . INFLUENZA VACCINE  Completed  . Hepatitis C Screening  Completed  . HIV Screening  Completed     Exam:  BP 119/73   Pulse 98   Wt 214 lb (97.1 kg)   BMI 30.71 kg/m  Gen: Well NAD HEENT: EOMI,  MMM Lungs: Normal work of breathing. CTABL Heart: RRR no MRG Abd: NABS, Soft. Nondistended, Non tender No masses. Negative murphy sign.  Exts: Brisk capillary refill, warm and well perfused.    No results found for this or any previous visit (from the past 72 hour(s)). No results found.    Assessment and Plan: 56 y.o. male with  Diabetes: Doing well. Continue current regemin, Plan to recheck A1c After March 12 (3 month). Anticipate much better control.  Serohepatitis: Seen on [place]. Plan for better DM and carb control. Labs are unremarkable and will be abstracted into the chart.   Kidney Cyst: Small but complex. Plan for a 3 month follow up US.   Shoulder pain: Plan for replacement. Pt would like a referral to Dr Rhona Raider anytime after A1c results are back. We had to delay surgery already  due to poor DM control previously.     No orders of the defined types were placed in this encounter.  No orders of the defined types were placed in this encounter.    Discussed warning signs or symptoms. Please see discharge instructions. Patient expresses understanding.

## 2017-01-04 ENCOUNTER — Encounter: Payer: Self-pay | Admitting: Family Medicine

## 2017-01-21 ENCOUNTER — Ambulatory Visit: Payer: 59 | Admitting: Family Medicine

## 2017-01-21 ENCOUNTER — Telehealth: Payer: Self-pay | Admitting: Family Medicine

## 2017-01-21 DIAGNOSIS — Z1211 Encounter for screening for malignant neoplasm of colon: Secondary | ICD-10-CM

## 2017-01-21 NOTE — Telephone Encounter (Signed)
Pt request a referral to have colonoscopy. Referral placed. Pt notified.

## 2017-01-30 ENCOUNTER — Encounter: Payer: Self-pay | Admitting: Family Medicine

## 2017-01-31 ENCOUNTER — Other Ambulatory Visit: Payer: Self-pay | Admitting: Family Medicine

## 2017-02-01 ENCOUNTER — Ambulatory Visit (INDEPENDENT_AMBULATORY_CARE_PROVIDER_SITE_OTHER): Payer: 59 | Admitting: Family Medicine

## 2017-02-01 VITALS — BP 121/76 | HR 82

## 2017-02-01 DIAGNOSIS — E1121 Type 2 diabetes mellitus with diabetic nephropathy: Secondary | ICD-10-CM

## 2017-02-01 DIAGNOSIS — E1165 Type 2 diabetes mellitus with hyperglycemia: Secondary | ICD-10-CM

## 2017-02-01 DIAGNOSIS — IMO0002 Reserved for concepts with insufficient information to code with codable children: Secondary | ICD-10-CM

## 2017-02-01 LAB — POCT GLYCOSYLATED HEMOGLOBIN (HGB A1C): Hemoglobin A1C: 7.3

## 2017-02-01 NOTE — Progress Notes (Signed)
Patient came into clinic today for a1c test. Pt reports his last level was high but he has started taking his medications and watching his diet. Pt also questions what happened with his colonoscopy referral. Looked in the system, it was documented that he will not be due for repeat until 2021. Pt states he thought he was supposed to get these every 5 years because his father died from prostate cancer. Will route to PCP for review.

## 2017-02-02 NOTE — Progress Notes (Signed)
Phone note sent

## 2017-03-08 ENCOUNTER — Ambulatory Visit (INDEPENDENT_AMBULATORY_CARE_PROVIDER_SITE_OTHER): Payer: 59 | Admitting: Physical Therapy

## 2017-03-08 DIAGNOSIS — M6281 Muscle weakness (generalized): Secondary | ICD-10-CM | POA: Diagnosis not present

## 2017-03-08 DIAGNOSIS — M25511 Pain in right shoulder: Secondary | ICD-10-CM | POA: Diagnosis not present

## 2017-03-08 DIAGNOSIS — R293 Abnormal posture: Secondary | ICD-10-CM

## 2017-03-08 DIAGNOSIS — M25611 Stiffness of right shoulder, not elsewhere classified: Secondary | ICD-10-CM

## 2017-03-08 NOTE — Therapy (Signed)
[place] [address], [place], [address] Phone: [phone]   Fax:  [phone]  Physical Therapy Evaluation  Patient Details  Name: Cristian Watkins MRN: [id number] Date of Birth: [birth year] Referring Provider: Dr. Tania Ade  Encounter Date: 03/08/2017      PT End of Session - 03/08/17 0922    Visit Number 1   Number of Visits 24   Date for PT Re-Evaluation 05/31/17   Authorization Type UHC - 30 visit limit   PT Start Time 0848   PT Stop Time 5916  declined ice today   PT Time Calculation (min) 32 min   Activity Tolerance Patient tolerated treatment well;No increased pain   Behavior During Therapy WFL for tasks assessed/performed      Past Medical History:  Diagnosis Date  . Diabetes [facility])     Past Surgical History:  Procedure Laterality Date  . rotator cuff surgeries  1999,2010    There were no vitals filed for this visit.       Subjective Assessment - 03/08/17 0851    Subjective Pt is a 56 y/o male who presents to OPPT s/p Rt RTC repair.  Pt reports this is 3rd repair on Rt side.  Pt presents today with continued pain and difficulty with ADLs.   Limitations Lifting;House hold activities   Patient Stated Goals improve pain, return to golfing   Currently in Pain? Yes   Pain Score 0-No pain  up to 7/10   Pain Location Shoulder   Pain Orientation Right   Pain Descriptors / Indicators Aching   Pain Type Surgical pain   Pain Onset 1 to 4 weeks ago   Pain Frequency Intermittent   Aggravating Factors  shower, being out of sling   Pain Relieving Factors sling            [facility] PT Assessment - 03/08/17 0853      Assessment   Medical Diagnosis Rt RTC repair   Referring Provider Dr. Tania Ade   Onset Date/Surgical Date 02/14/17   Hand Dominance Right   Next MD Visit 03/28/17   Prior Therapy none for this surgery     Precautions   Precaution Comments see protocol; no external  rotation beyond 6 weeks   Required Braces or Orthoses Sling     Restrictions   Weight Bearing Restrictions No     Balance Screen   Has the patient fallen in the past 6 months No   Has the patient had a decrease in activity level because of a fear of falling?  No   Is the patient reluctant to leave their home because of a fear of falling?  No     Home Environment   Living Environment Private residence   Living Arrangements Spouse/significant other   [place] None     Prior Function   Level of [place] On disability;Full time employment  short term/long term disability   Chief Strategy Officer, gates - standing all day   Leisure golfing, raquetball     Cognition   Overall Cognitive Status Within Functional Limits for tasks assessed     Observation/Other Assessments   Skin Integrity healing incisions-instructed in scar massage   Focus on Therapeutic Outcomes (FOTO)  29 (71% limited; predicted 40% limited)     Posture/Postural Control   Posture/Postural Control Postural limitations   Postural Limitations Rounded Shoulders;Forward head     PROM   PROM  Assessment Site Shoulder   Right/Left Shoulder Right   Right Shoulder Flexion 90 Degrees   Right Shoulder ABduction 50 Degrees   Right Shoulder Internal Rotation 23 Degrees  15 deg abdct                   [facility] Adult PT Treatment/Exercise - 03/08/17 0853      Exercises   Exercises Shoulder;Elbow;Wrist;Hand     Elbow Exercises   Elbow Extension Limitations supine stretch x 30-60 sec for HEP   Forearm Supination AROM;10 reps;Right   Forearm Pronation AROM;10 reps;Right     Shoulder Exercises: ROM/Strengthening   Pendulum ant/post, lateral and circles x 15 reps each     Hand Exercises   Other Hand Exercises stress ball squeeze x 10 reps for HEP                PT Education - 03/08/17 0921    Education provided Yes   Education Details  HEP   Person(s) Educated Patient   Methods Explanation;Demonstration;Handout   Comprehension Verbalized understanding;Returned demonstration          PT Short Term Goals - 03/08/17 1249      PT SHORT TERM GOAL #1   Title independent with initial HEP (04/05/17)   Time 4   Period Weeks   Status New     PT SHORT TERM GOAL #2   Title improve PROM Rt shoulder to full motion (within MD restrictions if still limited) for improved motion and function (04/05/17)   Time 4   Period Weeks   Status New           PT Long Term Goals - 03/08/17 1250      PT LONG TERM GOAL #1   Title independent with advanced HEP (05/31/17)   Time 12   Period Weeks   Status New     PT LONG TERM GOAL #2   Title improve AROM Rt shoulder to WNL for improved function and mobility (05/31/17)   Time 12   Period Weeks   Status New     PT LONG TERM GOAL #3   Title demonstrate at least 4/5 Rt shoulder strength for improved function and return to ADLs (05/31/17)   Time 12   Period Weeks   Status New     PT LONG TERM GOAL #4   Title perform golf simulated activities without pain or abnormal shoulder movements for return to recreational activities (05/31/17)   Time 12   Period Weeks   Status New               Plan - 03/08/17 1237    Clinical Impression Statement Pt is a 56 y/o male who presents to OPPT for moderate complexity PT eval s/p Rt RTC repair.  This is pt's 3rd repair of Rt RTC as well as diabetes which may impact healing and recovery.  Pt presents today with continued pain and decreased ROM, but all within protocol limitations at this time.  Initiated PROM and instructed in pendulums as well as elbow and wrist AROM exercises today.  Will benefit from PT to address deficits listed.     Rehab Potential Good   PT Frequency 2x / week  pt with 30 visit limit with insurance   PT Duration 12 weeks   PT Treatment/Interventions ADLs/Self Care Home Management;Cryotherapy;Electrical Stimulation;Moist  Heat;Ultrasound;Therapeutic exercise;Therapeutic activities;Patient/family education;Manual techniques;Scar mobilization;Passive range of motion;Vasopneumatic Device;Taping;Dry needling   PT Next Visit Plan review HEP, PROM to tolerance (no  er), follow protocol   Consulted and Agree with Plan of Care Patient      Patient will benefit from skilled therapeutic intervention in order to improve the following deficits and impairments:  Pain, Impaired UE functional use, Increased fascial restricitons, Decreased strength, Decreased range of motion, Postural dysfunction  Visit Diagnosis: Acute pain of right shoulder - Plan: PT plan of care cert/re-cert  Stiffness of right shoulder, not elsewhere classified - Plan: PT plan of care cert/re-cert  Abnormal posture - Plan: PT plan of care cert/re-cert  Muscle weakness (generalized) - Plan: PT plan of care cert/re-cert     Problem List Patient Active Problem List   Diagnosis Date Noted  . Steatohepatitis 12/15/2016  . Kidney cyst, acquired 12/07/2016  . Partial tear of right subscapularis tendon 09/15/2016  . Glenohumeral arthritis, right 09/15/2016  . Elevated C-reactive protein (CRP) 08/13/2016  . Lateral epicondylitis of both elbows 01/02/2016  . Patellofemoral arthralgia of both knees 10/31/2015  . Family history of prostate cancer 08/24/2013  . Insomnia 08/24/2013  . 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 History of sleep apnea 08/24/2013      Laureen Abrahams, PT, DPT 03/08/17 12:54 PM   [facility] [place], [place], [address] Phone: [phone]   Fax:  [phone]  Name: DEMONTEZ NOVACK MRN: [id number] Date of Birth: 12-30-1960

## 2017-03-08 NOTE — Patient Instructions (Signed)
Pendulum Circular    Bend forward 90 at waist, leaning on table for support. Rock body in a circular pattern to move arm clockwise __10-15__ times then counterclockwise __10-15__ times. Do __several (4-5)__ sessions per day.  Copyright  VHI. All rights reserved.    Pendulum Forward/Back    Bend forward 90 at waist, using table for support. Rock body forward and back to swing arm. Repeat __10-15 __ times. Do __several (4-5)__ sessions per day.  Copyright  VHI. All rights reserved.     Pendulum Side to Side    Bend forward 90 at waist, leaning on table for support. Rock body from side to side and let arm swing freely. Repeat _10-15___ times. Do _several (4-5)_ sessions per day.  Copyright  VHI. All rights reserved.     Resting on your back let arm straighten out to stretch biceps/elbow. Squeeze stress ball and keep wrist moving.

## 2017-03-11 ENCOUNTER — Ambulatory Visit (INDEPENDENT_AMBULATORY_CARE_PROVIDER_SITE_OTHER): Payer: 59 | Admitting: Physical Therapy

## 2017-03-11 DIAGNOSIS — M25511 Pain in right shoulder: Secondary | ICD-10-CM

## 2017-03-11 DIAGNOSIS — M6281 Muscle weakness (generalized): Secondary | ICD-10-CM

## 2017-03-11 DIAGNOSIS — M25611 Stiffness of right shoulder, not elsewhere classified: Secondary | ICD-10-CM

## 2017-03-11 DIAGNOSIS — R293 Abnormal posture: Secondary | ICD-10-CM

## 2017-03-11 NOTE — Therapy (Signed)
[place], [place], [address] Phone: [phone]   Fax:  [phone]  Physical Therapy Treatment  Patient Details  Name: Cristian Watkins MRN: [id number] Date of Birth: January 01, 1961 Referring Provider: Dr. Tania Ade  Encounter Date: 03/11/2017      PT End of Session - 03/11/17 1607    Visit Number 2   Number of Visits 24   Date for PT Re-Evaluation 05/31/17   Authorization Type UHC - 30 visit limit   PT Start Time 1532   PT Stop Time 1619   PT Time Calculation (min) 47 min   Activity Tolerance Patient tolerated treatment well   Behavior During Therapy [facility] for tasks assessed/performed      Past Medical History:  Diagnosis Date  . Diabetes [facility])     Past Surgical History:  Procedure Laterality Date  . rotator cuff surgeries  1999,2010    There were no vitals filed for this visit.      Subjective Assessment - 03/11/17 1534    Subjective Pt reports no new changes since last visit.    Currently in Pain? No/denies   Pain Score 0-No pain  up to 4/10 in shower            [facility] PT Assessment - 03/11/17 0001      Assessment   Medical Diagnosis Rt RTC repair   Referring Provider Dr. Tania Ade   Onset Date/Surgical Date 02/14/17   Hand Dominance Right   Next MD Visit 03/28/17           [facility] Adult PT Treatment/Exercise - 03/11/17 0001      Exercises   Exercises Wrist     Elbow Exercises   Elbow Flexion Strengthening;Right;Seated;20 reps  Rt shoulder neutral   Bar Weights/Barbell (Elbow Flexion) 1 lb     Shoulder Exercises: Seated   Other Seated Exercises shoulder rolls x 5 reps each direction     Shoulder Exercises: Standing   Retraction 10 reps  5 seconds, squeeze around pool noodle.      Wrist Exercises   Wrist Flexion Right;15 reps;Seated;Bar weights/barbell   Bar Weights/Barbell (Wrist Flexion) 2 lbs   Wrist Extension Strengthening;Right;15  reps;Seated;Bar weights/barbell   Bar Weights/Barbell (Wrist Extension) 2 lbs   Wrist Radial Deviation Strengthening;Right;15 reps;Seated;Bar weights/barbell   Bar Weights/Barbell (Radial Deviation) 2 lbs     Modalities   Modalities Vasopneumatic     Vasopneumatic   Number Minutes Vasopneumatic  15 minutes   Vasopnuematic Location  Shoulder  Rt   Vasopneumatic Pressure Low   Vasopneumatic Temperature  34 deg     Manual Therapy   Manual Therapy Passive ROM   Manual therapy comments Pt supine    Passive ROM Rt shoulder ext, flex, scaption, IR to tolerance (flex / scap below 90 deg, no ER past neutral); VC to remain passive, pt very guarded.                   PT Short Term Goals - 03/08/17 1249      PT SHORT TERM GOAL #1   Title independent with initial HEP (04/05/17)   Time 4   Period Weeks   Status New     PT SHORT TERM GOAL #2   Title improve PROM Rt shoulder to full motion (within MD restrictions if still limited) for improved motion and function (04/05/17)   Time 4   Period Weeks   Status New  PT Long Term Goals - 03/08/17 1250      PT LONG TERM GOAL #1   Title independent with advanced HEP (05/31/17)   Time 12   Period Weeks   Status New     PT LONG TERM GOAL #2   Title improve AROM Rt shoulder to WNL for improved function and mobility (05/31/17)   Time 12   Period Weeks   Status New     PT LONG TERM GOAL #3   Title demonstrate at least 4/5 Rt shoulder strength for improved function and return to ADLs (05/31/17)   Time 12   Period Weeks   Status New     PT LONG TERM GOAL #4   Title perform golf simulated activities without pain or abnormal shoulder movements for return to recreational activities (05/31/17)   Time 12   Period Weeks   Status New               Plan - 03/11/17 1622    Clinical Impression Statement Pt very guarded with PROM of Rt shoulder, reported min increase in pain with this.  Pt instructed in wrist, and  elbow flexion strengthening with light resistance.  Pt reported reduced pain with rest and use of vaso at end of session.    Rehab Potential Good   PT Frequency 2x / week   PT Duration 12 weeks   PT Treatment/Interventions ADLs/Self Care Home Management;Cryotherapy;Electrical Stimulation;Moist Heat;Ultrasound;Therapeutic exercise;Therapeutic activities;Patient/family education;Manual techniques;Scar mobilization;Passive range of motion;Vasopneumatic Device;Taping;Dry needling   PT Next Visit Plan review HEP, PROM to tolerance (no ER), follow protocol   Consulted and Agree with Plan of Care Patient      Patient will benefit from skilled therapeutic intervention in order to improve the following deficits and impairments:  Pain, Impaired UE functional use, Increased fascial restricitons, Decreased strength, Decreased range of motion, Postural dysfunction  Visit Diagnosis: Acute pain of right shoulder  Stiffness of right shoulder, not elsewhere classified  Abnormal posture  Muscle weakness (generalized)     Problem List Patient Active Problem List   Diagnosis Date Noted  . Steatohepatitis 12/15/2016  . Kidney cyst, acquired 12/07/2016  . Partial tear of right subscapularis tendon 09/15/2016  . Glenohumeral arthritis, right 09/15/2016  . Elevated C-reactive protein (CRP) 08/13/2016  . Lateral epicondylitis of both elbows 01/02/2016  . Patellofemoral arthralgia of both knees 10/31/2015  . Family history of prostate cancer 08/24/2013  . Insomnia 08/24/2013  . 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 History of sleep apnea 08/24/2013    Kerin Perna, PTA 03/11/17 4:26 PM  [facility] [place], [place], [address] Phone: [phone]   Fax:  [phone]  Name: JAYVON MOUNGER MRN: [id number] Date of Birth: 25-Dec-1960

## 2017-03-15 ENCOUNTER — Ambulatory Visit (INDEPENDENT_AMBULATORY_CARE_PROVIDER_SITE_OTHER): Payer: 59 | Admitting: Physical Therapy

## 2017-03-15 DIAGNOSIS — R293 Abnormal posture: Secondary | ICD-10-CM | POA: Diagnosis not present

## 2017-03-15 DIAGNOSIS — M25611 Stiffness of right shoulder, not elsewhere classified: Secondary | ICD-10-CM | POA: Diagnosis not present

## 2017-03-15 DIAGNOSIS — M25511 Pain in right shoulder: Secondary | ICD-10-CM | POA: Diagnosis not present

## 2017-03-15 DIAGNOSIS — M6281 Muscle weakness (generalized): Secondary | ICD-10-CM

## 2017-03-15 NOTE — Therapy (Signed)
[place], [place], [address] Phone: [phone]   Fax:  [phone]  Physical Therapy Treatment  Patient Details  Name: Cristian Watkins MRN: [id number] Date of Birth: 1961-07-11 Referring Provider: Dr. Tania Ade   Encounter Date: 03/15/2017      PT End of Session - 03/15/17 0808    Visit Number 3   Number of Visits 24   Date for PT Re-Evaluation 05/31/17   Authorization Type UHC - 30 visit limit   PT Start Time 0805   PT Stop Time 0858   PT Time Calculation (min) 53 min   Activity Tolerance Patient tolerated treatment well   Behavior During Therapy [facility] for tasks assessed/performed      Past Medical History:  Diagnosis Date  . Diabetes [facility])     Past Surgical History:  Procedure Laterality Date  . rotator cuff surgeries  1999,2010    There were no vitals filed for this visit.      Subjective Assessment - 03/15/17 1306    Subjective Pt reports no new changes since last visit.  He is still wearing sling 24/7. Sleeping on couch until he is out of sling. Not sleeping well, only 3 hrs at a time.    Patient Stated Goals improve pain, return to golfing   Currently in Pain? No/denies   Pain Score 0-No pain            [facility] PT Assessment - 03/15/17 0001      Assessment   Medical Diagnosis Rt RTC repair   Referring Provider Dr. Tania Ade    Onset Date/Surgical Date 02/14/17   Hand Dominance Right   Next MD Visit 03/28/17     Precautions   Precaution Comments see protocol; no external rotation beyond neutral for 6 weeks   Required Braces or Orthoses Sling     ROM / Strength   AROM / PROM / Strength Strength;PROM     PROM   PROM Assessment Site Shoulder   Right/Left Shoulder Right   Right Shoulder Flexion 90 Degrees   Right Shoulder ABduction 90 Degrees  scaption     Strength   Strength Assessment Site Hand   Right/Left hand Right;Left   Right Hand Grip (lbs) 59    Left Hand Grip (lbs) 75          [facility] Adult PT Treatment/Exercise - 03/15/17 0001      Self-Care   Self-Care Other Self-Care Comments   Other Self-Care Comments  Pt educated on self massage to Rt shoulder girdle with ball. Pt returned demo and verbalized understanding.      Elbow Exercises   Elbow Flexion Strengthening;Right;Seated;20 reps  Rt shoulder neutral   Bar Weights/Barbell (Elbow Flexion) 2 lbs   Forearm Supination Strengthening;Right;20 reps;Seated;Bar weights/barbell  elbow supported, 2#   Wrist Flexion Right;15 reps;Seated;Bar weights/barbell   Bar Weights/Barbell (Wrist Flexion) 2 lbs   Wrist Extension Strengthening;Right;15 reps;Seated;Bar weights/barbell   Bar Weights/Barbell (Wrist Extension) 2 lbs     Shoulder Exercises: Seated   Retraction Both;10 reps  10 sec hold, around pool noodle.      Hand Exercises   Other Hand Exercises Red- med grip ball, then finger ext x 20 reps     Wrist Exercises   Wrist Radial Deviation Strengthening;Right;Seated;Bar weights/barbell;20 reps   Bar Weights/Barbell (Radial Deviation) 2 lbs     Vasopneumatic   Number Minutes Vasopneumatic  15 minutes   Vasopnuematic Location  Shoulder  Rt  Vasopneumatic Pressure Low   Vasopneumatic Temperature  34 deg     Manual Therapy   Manual Therapy Passive ROM;Scapular mobilization   Scapular Mobilization Rt shoulder - all directions, pt somewhat guarded.    Passive ROM Rt shoulder ext, flex, scaption, IR to tolerance (flex / scap below 90 deg, no ER past neutral); VC to remain passive, pt less guarded.                 PT Education - 03/15/17 0847    Education provided Yes   Education Details HEP   Person(s) Educated Patient   Methods Explanation;Handout;Verbal cues   Comprehension Returned demonstration;Verbalized understanding          PT Short Term Goals - 03/15/17 1305      PT SHORT TERM GOAL #1   Title independent with initial HEP (04/05/17)   Time 4    Period Weeks   Status On-going     PT SHORT TERM GOAL #2   Title improve PROM Rt shoulder to full motion (within MD restrictions if still limited) for improved motion and function (04/05/17)   Time 4   Period Weeks   Status On-going           PT Long Term Goals - 03/15/17 1305      PT LONG TERM GOAL #1   Title independent with advanced HEP (05/31/17)   Time 12   Period Weeks   Status On-going     PT LONG TERM GOAL #2   Title improve AROM Rt shoulder to WNL for improved function and mobility (05/31/17)   Time 12   Period Weeks   Status On-going     PT LONG TERM GOAL #3   Title demonstrate at least 4/5 Rt shoulder strength for improved function and return to ADLs (05/31/17)   Time 12   Period Weeks   Status On-going     PT LONG TERM GOAL #4   Title perform golf simulated activities without pain or abnormal shoulder movements for return to recreational activities (05/31/17)   Time 12   Period Weeks   Status On-going               Plan - 03/15/17 3790    Clinical Impression Statement Pt demonstrated improved Rt shoulder scaption PROM, less guarding today. Pt reported pain up to 5/10 with PROM, reduced with rest.   Decreased Rt hand grip strength compared to Lt; encouraged pt to continue grip exercises for HEP.    Rehab Potential Good   PT Frequency 2x / week   PT Duration 12 weeks   PT Treatment/Interventions ADLs/Self Care Home Management;Cryotherapy;Electrical Stimulation;Moist Heat;Ultrasound;Therapeutic exercise;Therapeutic activities;Patient/family education;Manual techniques;Scar mobilization;Passive range of motion;Vasopneumatic Device;Taping;Dry needling   PT Next Visit Plan  PROM to tolerance (no ER), follow protocol, modalities as needed.    Consulted and Agree with Plan of Care Patient      Patient will benefit from skilled therapeutic intervention in order to improve the following deficits and impairments:  Pain, Impaired UE functional use, Increased  fascial restricitons, Decreased strength, Decreased range of motion, Postural dysfunction  Visit Diagnosis: Acute pain of right shoulder  Stiffness of right shoulder, not elsewhere classified  Abnormal posture  Muscle weakness (generalized)     Problem List Patient Active Problem List   Diagnosis Date Noted  . Steatohepatitis 12/15/2016  . Kidney cyst, acquired 12/07/2016  . Partial tear of right subscapularis tendon 09/15/2016  . Glenohumeral arthritis, right 09/15/2016  . Elevated  C-reactive protein (CRP) 08/13/2016  . Lateral epicondylitis of both elbows 01/02/2016  . Patellofemoral arthralgia of both knees 10/31/2015  . Family history of prostate cancer 08/24/2013  . Insomnia 08/24/2013  . Uncontrolled type 2 diabetes with renal manifestation ([place]) 08/24/2013  . History of sleep apnea 08/24/2013   Kerin Perna, PTA 03/15/17 1:07 PM  [facility] [place], [place], [address] Phone: [phone]   Fax:  [phone]  Name: HERMENEGILDO CLAUSEN MRN: [id number] Date of Birth: 1961/08/23

## 2017-03-15 NOTE — Patient Instructions (Signed)
*   Self massage to muscles surrounding Right shoulder with ball against wall (including pec muscle) * Grip exercises * Continue pendulum, shoulder blade squeezes, shoulder rolls, wrist strengthening with 1-2# weight.    [facility] at [facility] [place], [place] [address]  [phone] (office) [phone] (fax)

## 2017-03-18 ENCOUNTER — Ambulatory Visit (INDEPENDENT_AMBULATORY_CARE_PROVIDER_SITE_OTHER): Payer: 59 | Admitting: Physical Therapy

## 2017-03-18 DIAGNOSIS — M6281 Muscle weakness (generalized): Secondary | ICD-10-CM | POA: Diagnosis not present

## 2017-03-18 DIAGNOSIS — R293 Abnormal posture: Secondary | ICD-10-CM

## 2017-03-18 DIAGNOSIS — M25511 Pain in right shoulder: Secondary | ICD-10-CM

## 2017-03-18 DIAGNOSIS — M25611 Stiffness of right shoulder, not elsewhere classified: Secondary | ICD-10-CM

## 2017-03-18 NOTE — Therapy (Signed)
[place] [facility] [place], [place], [address] Phone: [phone]   Fax:  [phone]  Physical Therapy Treatment  Patient Details  Name: Cristian Watkins MRN: [id number] Date of Birth: June 03, 1961 Referring Provider: Dr. Tania Ade   Encounter Date: 03/18/2017      PT End of Session - 03/18/17 0903    Visit Number 4   Number of Visits 24   Date for PT Re-Evaluation 05/31/17   Authorization Type UHC - 30 visit limit   PT Start Time 0802   PT Stop Time 0857   PT Time Calculation (min) 55 min   Activity Tolerance Patient tolerated treatment well   Behavior During Therapy [facility] for tasks assessed/performed      Past Medical History:  Diagnosis Date  . Diabetes [facility])     Past Surgical History:  Procedure Laterality Date  . rotator cuff surgeries  1999,2010    There were no vitals filed for this visit.      Subjective Assessment - 03/18/17 0955    Subjective Pt reports he has been doing all the new exercises given over last 2 sessions.  He starting squeezing clamp in his Rt hand during day. Pt now able to don sling by himself.    Currently in Pain? No/denies   Pain Score 0-No pain            [facility] PT Assessment - 03/18/17 0001      Strength   Right Hand Grip (lbs) 85   Left Hand Grip (lbs) 80          [facility] Adult PT Treatment/Exercise - 03/18/17 0001      Elbow Exercises   Elbow Flexion Strengthening;Right;Seated;20 reps  Rt shoulder neutral, 10 reps with each wt   Bar Weights/Barbell (Elbow Flexion) 2 lbs;3 lbs   Forearm Supination Strengthening;Right;20 reps;Seated;Bar weights/barbell  elbow supported, 3#   Forearm Pronation Strengthening;Right;20 reps;Seated;Bar weights/barbell  elbow supported, 3#   Wrist Flexion Right;15 reps;Seated;Bar weights/barbell   Bar Weights/Barbell (Wrist Flexion) 3 lbs   Wrist Extension Strengthening;Right;15 reps;Seated;Bar weights/barbell   Bar  Weights/Barbell (Wrist Extension) 3 lbs   Other elbow exercises velcro board:  attempted wrist flexion/ext with shoulder neutral- painful stopped.  Rt wrist supination/pronation with key grip      Shoulder Exercises: Supine   Other Supine Exercises scap retraction squeeze x 5 sec hold x 10 reps, scap depression x 5 sec hold x 10 reps      Vasopneumatic   Number Minutes Vasopneumatic  15 minutes   Vasopnuematic Location  Shoulder  Rt   Vasopneumatic Pressure Low   Vasopneumatic Temperature  34 deg     Manual Therapy   Manual Therapy Myofascial release;Passive ROM   Myofascial Release Rt pec release   Passive ROM Rt shoulder ext, flex, scaption, IR to tolerance (flex / scap to ~90-100 deg, no ER past neutral); VC to remain passive, pt less guarded.                   PT Short Term Goals - 03/15/17 1305      PT SHORT TERM GOAL #1   Title independent with initial HEP (04/05/17)   Time 4   Period Weeks   Status On-going     PT SHORT TERM GOAL #2   Title improve PROM Rt shoulder to full motion (within MD restrictions if still limited) for improved motion and function (04/05/17)   Time 4   Period Weeks  Status On-going           PT Long Term Goals - 03/15/17 1305      PT LONG TERM GOAL #1   Title independent with advanced HEP (05/31/17)   Time 12   Period Weeks   Status On-going     PT LONG TERM GOAL #2   Title improve AROM Rt shoulder to WNL for improved function and mobility (05/31/17)   Time 12   Period Weeks   Status On-going     PT LONG TERM GOAL #3   Title demonstrate at least 4/5 Rt shoulder strength for improved function and return to ADLs (05/31/17)   Time 12   Period Weeks   Status On-going     PT LONG TERM GOAL #4   Title perform golf simulated activities without pain or abnormal shoulder movements for return to recreational activities (05/31/17)   Time 12   Period Weeks   Status On-going               Plan - 03/18/17 0956    Clinical  Impression Statement Pt tolerated PROM to ~95 deg of flexion and scaption; continues to req some cues for less guarding.  Pain is up to 5-6/10 with PROM, although not reported until after completed.  Significant improvement in grip strength since last assessment.  Pt will benefit from continued PT intervention to max functional mobility.    Rehab Potential Good   PT Frequency 2x / week   PT Duration 12 weeks   PT Treatment/Interventions ADLs/Self Care Home Management;Cryotherapy;Electrical Stimulation;Moist Heat;Ultrasound;Therapeutic exercise;Therapeutic activities;Patient/family education;Manual techniques;Scar mobilization;Passive range of motion;Vasopneumatic Device;Taping;Dry needling   PT Next Visit Plan  PROM to tolerance (no ER until 6wks), follow protocol, modalities as needed.    Consulted and Agree with Plan of Care Patient      Patient will benefit from skilled therapeutic intervention in order to improve the following deficits and impairments:  Pain, Impaired UE functional use, Increased fascial restricitons, Decreased strength, Decreased range of motion, Postural dysfunction  Visit Diagnosis: Acute pain of right shoulder  Stiffness of right shoulder, not elsewhere classified  Abnormal posture  Muscle weakness (generalized)     Problem List Patient Active Problem List   Diagnosis Date Noted  . Steatohepatitis 12/15/2016  . Kidney cyst, acquired 12/07/2016  . Partial tear of right subscapularis tendon 09/15/2016  . Glenohumeral arthritis, right 09/15/2016  . Elevated C-reactive protein (CRP) 08/13/2016  . Lateral epicondylitis of both elbows 01/02/2016  . Patellofemoral arthralgia of both knees 10/31/2015  . Family history of prostate cancer 08/24/2013  . Insomnia 08/24/2013  . 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 History of sleep apnea 08/24/2013   Kerin Perna, PTA 03/18/17 10:00 AM  [place], [place], [address] Phone: [phone]   Fax:  [phone]  Name: Cristian Watkins MRN: [id number] Date of Birth: 25-Apr-1961

## 2017-03-22 ENCOUNTER — Ambulatory Visit (INDEPENDENT_AMBULATORY_CARE_PROVIDER_SITE_OTHER): Payer: 59 | Admitting: Physical Therapy

## 2017-03-22 DIAGNOSIS — M25611 Stiffness of right shoulder, not elsewhere classified: Secondary | ICD-10-CM | POA: Diagnosis not present

## 2017-03-22 DIAGNOSIS — R293 Abnormal posture: Secondary | ICD-10-CM | POA: Diagnosis not present

## 2017-03-22 DIAGNOSIS — M25511 Pain in right shoulder: Secondary | ICD-10-CM

## 2017-03-22 DIAGNOSIS — M6281 Muscle weakness (generalized): Secondary | ICD-10-CM | POA: Diagnosis not present

## 2017-03-22 NOTE — Therapy (Signed)
[place], [place], [address] Phone: [phone]   Fax:  [phone]  Physical Therapy Treatment  Patient Details  Name: Cristian Watkins MRN: [id number] Date of Birth: 1961-06-07 Referring Provider: Dr. Tania Ade   Encounter Date: 03/22/2017      PT End of Session - 03/22/17 0809    Visit Number 5   Number of Visits 24   Date for PT Re-Evaluation 05/31/17   Authorization Type UHC - 30 visit limit   PT Start Time 0806   PT Stop Time 0900   PT Time Calculation (min) 54 min      Past Medical History:  Diagnosis Date  . Diabetes [facility])     Past Surgical History:  Procedure Laterality Date  . rotator cuff surgeries  1999,2010    There were no vitals filed for this visit.      Subjective Assessment - 03/22/17 0809    Subjective [name] reports he has only done exercises 1x since last visit. He has been busy around house; yard work and helping with baby shower while in sling.     Currently in Pain? No/denies   Pain Score 0-No pain   Pain Location Shoulder   Pain Orientation Right            [facility] PT Assessment - 03/22/17 0001      Assessment   Medical Diagnosis Rt RTC repair   Referring Provider Dr. Tania Ade    Onset Date/Surgical Date 02/14/17   Hand Dominance Right   Next MD Visit 03/28/17          [facility] Adult PT Treatment/Exercise - 03/22/17 0001      Shoulder Exercises: Supine   Other Supine Exercises scap squeeze x 5 sec hold x 10 reps      Shoulder Exercises: Prone   Retraction Both;5 reps  unable to properly contract   Other Prone Exercises trial of prone row to neutral, 3 reps; unable to tolerate. Switched to prone scap retraction with arm supported at side; unable to perform.      Shoulder Exercises: Sidelying   Other Sidelying Exercises attempts at scap retraction, unable to properly engage, despite tactile cues.      Shoulder Exercises: Standing   Retraction 10 reps  5 seconds, squeeze around pool noodle.      Shoulder Exercises: Pulleys   Flexion 3 minutes  to tolerance     Shoulder Exercises: Therapy Ball   Other Therapy Ball Exercises ball at side, rolling forward and backward, small circles CW/CCW (with scap depression)   Other Therapy Ball Exercises Seated ball roll-out with arms relaxed on ball (AAROM flexion, to tolerance) x 15     Shoulder Exercises: Stretch   Table Stretch - Flexion 2 reps   Table Stretch -Flexion Limitations RUE assist to bring arm back, reduced pain.      Vasopneumatic   Number Minutes Vasopneumatic  15 minutes   Vasopnuematic Location  Shoulder  Rt   Vasopneumatic Pressure Low   Vasopneumatic Temperature  34 deg     Manual Therapy   Passive ROM Rt shoulder ext, flex, scaption, IR to tolerance (flex / scap to ~90-100 deg, no ER past neutral); VC to remain passive, pt less guarded.                 PT Education - 03/22/17 [phone]    Education provided Yes   Person(s) Educated Patient   Methods Explanation;Handout;Demonstration  Comprehension Verbalized understanding;Returned demonstration          PT Short Term Goals - 03/15/17 1305      PT SHORT TERM GOAL #1   Title independent with initial HEP (04/05/17)   Time 4   Period Weeks   Status On-going     PT SHORT TERM GOAL #2   Title improve PROM Rt shoulder to full motion (within MD restrictions if still limited) for improved motion and function (04/05/17)   Time 4   Period Weeks   Status On-going           PT Long Term Goals - 03/15/17 1305      PT LONG TERM GOAL #1   Title independent with advanced HEP (05/31/17)   Time 12   Period Weeks   Status On-going     PT LONG TERM GOAL #2   Title improve AROM Rt shoulder to WNL for improved function and mobility (05/31/17)   Time 12   Period Weeks   Status On-going     PT LONG TERM GOAL #3   Title demonstrate at least 4/5 Rt shoulder strength for improved function and  return to ADLs (05/31/17)   Time 12   Period Weeks   Status On-going     PT LONG TERM GOAL #4   Title perform golf simulated activities without pain or abnormal shoulder movements for return to recreational activities (05/31/17)   Time 12   Period Weeks   Status On-going               Plan - 03/22/17 0916    Clinical Impression Statement Pt had difficulty performing scap retraction and depression in prone, sitting, and sidelying despite VC/tactile cues.  Pain elevated to 6/10 with AAROM/PROM, reduced to 0/10 with rest.    Making gradual gains towards established goals.   Rehab Potential Good   PT Frequency 2x / week   PT Duration 12 weeks   PT Treatment/Interventions ADLs/Self Care Home Management;Cryotherapy;Electrical Stimulation;Moist Heat;Ultrasound;Therapeutic exercise;Therapeutic activities;Patient/family education;Manual techniques;Scar mobilization;Passive range of motion;Vasopneumatic Device;Taping;Dry needling   PT Next Visit Plan  PROM/AAROM to tolerance (no ER until 6wks), per protocol, modalities as needed.    Consulted and Agree with Plan of Care Patient      Patient will benefit from skilled therapeutic intervention in order to improve the following deficits and impairments:  Pain, Impaired UE functional use, Increased fascial restricitons, Decreased strength, Decreased range of motion, Postural dysfunction  Visit Diagnosis: Acute pain of right shoulder  Stiffness of right shoulder, not elsewhere classified  Abnormal posture  Muscle weakness (generalized)     Problem List Patient Active Problem List   Diagnosis Date Noted  . Steatohepatitis 12/15/2016  . Kidney cyst, acquired 12/07/2016  . Partial tear of right subscapularis tendon 09/15/2016  . Glenohumeral arthritis, right 09/15/2016  . Elevated C-reactive protein (CRP) 08/13/2016  . Lateral epicondylitis of both elbows 01/02/2016  . Patellofemoral arthralgia of both knees 10/31/2015  . Family  history of prostate cancer 08/24/2013  . Insomnia 08/24/2013  . 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 History of sleep apnea 08/24/2013   Kerin Perna, PTA 03/22/17 1:09 PM  [place], [place], [address] Phone: [phone]   Fax:  [phone]  Name: AMBROSE WILE MRN: [id number] Date of Birth: 08/23/1961

## 2017-03-22 NOTE — Patient Instructions (Signed)
Flexion (Passive)    Sitting upright, slide forearm forward along table, bending from the waist until a stretch is felt. Hold __5-10__ seconds. Repeat _10___ times. Do _2-3___ sessions per day.   [facility] at [facility] [place], [place] [address]  [phone] (office) [phone] (fax)

## 2017-03-25 ENCOUNTER — Ambulatory Visit (INDEPENDENT_AMBULATORY_CARE_PROVIDER_SITE_OTHER): Payer: 59 | Admitting: Physical Therapy

## 2017-03-25 DIAGNOSIS — R293 Abnormal posture: Secondary | ICD-10-CM | POA: Diagnosis not present

## 2017-03-25 DIAGNOSIS — M25611 Stiffness of right shoulder, not elsewhere classified: Secondary | ICD-10-CM

## 2017-03-25 DIAGNOSIS — M6281 Muscle weakness (generalized): Secondary | ICD-10-CM | POA: Diagnosis not present

## 2017-03-25 DIAGNOSIS — M25511 Pain in right shoulder: Secondary | ICD-10-CM | POA: Diagnosis not present

## 2017-03-25 NOTE — Therapy (Signed)
[place], [place], [address] Phone: [phone]   Fax:  [phone]  Physical Therapy Treatment  Patient Details  Name: Cristian Watkins MRN: [id number] Date of Birth: 1961-04-12 Referring Provider: Dr. Tania Ade   Encounter Date: 03/25/2017      PT End of Session - 03/25/17 0807    Visit Number 6   Number of Visits 24   Date for PT Re-Evaluation 05/31/17   Authorization Type UHC - 30 visit limit   PT Start Time 0802   PT Stop Time 0919   PT Time Calculation (min) 77 min   Activity Tolerance Patient tolerated treatment well   Behavior During Therapy [facility] for tasks assessed/performed      Past Medical History:  Diagnosis Date  . Diabetes [facility])     Past Surgical History:  Procedure Laterality Date  . rotator cuff surgeries  1999,2010    There were no vitals filed for this visit.      Subjective Assessment - 03/25/17 0808    Subjective Pt reports no new changes since last visit.    Patient Stated Goals improve pain, return to golfing   Currently in Pain? No/denies   Pain Score 0-No pain   Pain Location Shoulder   Pain Orientation Right            [facility] PT Assessment - 03/25/17 0001      Assessment   Medical Diagnosis Rt RTC repair   Referring Provider Dr. Tania Ade    Onset Date/Surgical Date 02/14/17   Hand Dominance Right   Next MD Visit 03/28/17   Prior Therapy none for this surgery                     [facility] Adult PT Treatment/Exercise - 03/25/17 0001      Therapeutic Activites    Therapeutic Activities --  instructed in myofacial ball release - tspine,pecs.biceps      Neuro Re-ed    Neuro Re-ed Details  working on facilitation of posterior shoulder girdle - specifically middle and lower trap with various techniques/activities      Shoulder Exercises: Supine   Other Supine Exercises hooklying on pool noodle:  scap retraction/ depression with  VC/ tactile cues.      Shoulder Exercises: Standing   Retraction 10 reps  back against pool noodle.    Retraction Limitations tactile cues and VC to initiate scap depression and restraction.       Shoulder Exercises: Pulleys   Flexion 3 minutes  to tolerance    ABduction 2 minutes  scaption     Vasopneumatic   Number Minutes Vasopneumatic  15 minutes   Vasopnuematic Location  Shoulder  Rt   Vasopneumatic Pressure Low   Vasopneumatic Temperature  34 deg     Manual Therapy   Manual Therapy Passive ROM;Joint mobilization;Soft tissue mobilization   Joint Mobilization CPA thoracic mobs in sitting, grade II, III; RUE supported with pillow in lap.  Lateral thoracic mobs Rt to Lt and Lt to Rt   provided by Gillermo Murdoch, PT   Soft tissue mobilization STM to Rt pec major/minor; biceps; upper trap; teres pt supine    Myofascial Release bilat pec in standing, therapist pushing pt against pool noodle; pecs/anterior chest pt supine    Scapular Mobilization Rt    Passive ROM thoracic ext in sitting;  in supine: Rt shoulder flexion, ext, scaption.  PT Short Term Goals - 03/25/17 7517      PT SHORT TERM GOAL #1   Title independent with initial HEP (04/05/17)   Time 4   Period Weeks   Status On-going     PT SHORT TERM GOAL #2   Title improve PROM Rt shoulder to full motion (within MD restrictions if still limited) for improved motion and function (04/05/17)   Time 4   Period Weeks   Status On-going           PT Long Term Goals - 03/25/17 0017      PT LONG TERM GOAL #1   Title independent with advanced HEP (05/31/17)   Time 12   Period Weeks   Status On-going     PT LONG TERM GOAL #2   Title improve AROM Rt shoulder to WNL for improved function and mobility (05/31/17)   Time 12   Period Weeks   Status On-going     PT LONG TERM GOAL #3   Title demonstrate at least 4/5 Rt shoulder strength for improved function and return to ADLs (05/31/17)   Time 12    Period Weeks   Status On-going     PT LONG TERM GOAL #4   Title perform golf simulated activities without pain or abnormal shoulder movements for return to recreational activities (05/31/17)   Time 12   Period Weeks   Status On-going               Plan - 03/25/17 4944    Clinical Impression Statement Pt continues with difficulty initiating Rt scap retraction/depression. Some minor improvement in this after manual therapy and neuromuscular re-education. Significant tightness noted in pecs; biceps; teres Rt shoulder girdle. patient demonstrates poor movement quality with patterns of substitution evident.  Pt making gradual gains towards established goals.  Pt is 5 wks post RTC repair; will be allowed to perform ER at 6 wks.     Rehab Potential Good   PT Frequency 2x / week   PT Duration 12 weeks   PT Treatment/Interventions ADLs/Self Care Home Management;Cryotherapy;Electrical Stimulation;Moist Heat;Ultrasound;Therapeutic exercise;Therapeutic activities;Patient/family education;Manual techniques;Scar mobilization;Passive range of motion;Vasopneumatic Device;Taping;Dry needling   PT Next Visit Plan  PROM/AAROM to tolerance (no ER until 6wks), per protocol, modalities as needed.  MD note.    Consulted and Agree with Plan of Care Patient      Patient will benefit from skilled therapeutic intervention in order to improve the following deficits and impairments:  Pain, Impaired UE functional use, Increased fascial restricitons, Decreased strength, Decreased range of motion, Postural dysfunction  Visit Diagnosis: Acute pain of right shoulder  Stiffness of right shoulder, not elsewhere classified  Abnormal posture  Muscle weakness (generalized)     Problem List Patient Active Problem List   Diagnosis Date Noted  . Steatohepatitis 12/15/2016  . Kidney cyst, acquired 12/07/2016  . Partial tear of right subscapularis tendon 09/15/2016  . Glenohumeral arthritis, right 09/15/2016   . Elevated C-reactive protein (CRP) 08/13/2016  . Lateral epicondylitis of both elbows 01/02/2016  . Patellofemoral arthralgia of both knees 10/31/2015  . Family history of prostate cancer 08/24/2013  . Insomnia 08/24/2013  . Uncontrolled type 2 diabetes with renal manifestation ([place]) 08/24/2013  . History of sleep apnea 08/24/2013   Kerin Perna, PTA 03/25/17 9:35 AM  Celyn P. Helene Kelp PT, MPH 03/25/17 9:35 AM   [place], [place], [address] Phone: [phone]   Fax:  [phone]  Name: Cristian Watkins MRN: [id number] Date of Birth: 1961-02-26

## 2017-03-28 ENCOUNTER — Encounter: Payer: 59 | Admitting: Physical Therapy

## 2017-03-29 ENCOUNTER — Encounter: Payer: Self-pay | Admitting: Rehabilitative and Restorative Service Providers"

## 2017-03-29 ENCOUNTER — Ambulatory Visit (INDEPENDENT_AMBULATORY_CARE_PROVIDER_SITE_OTHER): Payer: 59 | Admitting: Rehabilitative and Restorative Service Providers"

## 2017-03-29 DIAGNOSIS — M25511 Pain in right shoulder: Secondary | ICD-10-CM

## 2017-03-29 DIAGNOSIS — M25611 Stiffness of right shoulder, not elsewhere classified: Secondary | ICD-10-CM

## 2017-03-29 DIAGNOSIS — M6281 Muscle weakness (generalized): Secondary | ICD-10-CM | POA: Diagnosis not present

## 2017-03-29 DIAGNOSIS — R293 Abnormal posture: Secondary | ICD-10-CM | POA: Diagnosis not present

## 2017-03-29 NOTE — Therapy (Signed)
[place], [place], [address] Phone: [phone]   Fax:  [phone]  Physical Therapy Treatment  Patient Details  Name: Cristian Watkins MRN: [id number] Date of Birth: 1961-09-13 Referring Provider: Dr. Tania Ade   Encounter Date: 03/29/2017      PT End of Session - 03/29/17 0933    Visit Number 7   Number of Visits 24   Date for PT Re-Evaluation 05/31/17   Authorization Type UHC - 30 visit limit   PT Start Time 0933   PT Stop Time 1030   PT Time Calculation (min) 57 min   Activity Tolerance Patient tolerated treatment well      Past Medical History:  Diagnosis Date  . Diabetes [facility])     Past Surgical History:  Procedure Laterality Date  . rotator cuff surgeries  1999,2010    There were no vitals filed for this visit.      Subjective Assessment - 03/29/17 0934    Subjective Took his sling off yesterday. No increased soreness or problems. Will be travelling to [place]. Suggested he use the sling for travel. Saw MD yesterday. He is pleased with pt progress. Scheduled to see MD 05/11/17. Scheduled to RTW with restrictioin 05/02/17.    Currently in Pain? No/denies                         [facility] Adult PT Treatment/Exercise - 03/29/17 0001      Exercises   Exercises --  posterior shoulder rolls intermittent through treatment      Shoulder Exercises: Seated   Abduction AROM;Right;Left;20 reps  initiation of the deltoid sm range-elbows bent     Shoulder Exercises: Standing   External Rotation AROM;Right;Left;10 reps  with noodle    ABduction AROM;Right;Left;5 reps  scaption with noodle    Row AROM;20 reps  arms at side w/ knee bent start moving to ext arms at side    Retraction 10 reps  back against pool noodle - repeated throughout the treatment   Retraction Limitations tactile cues and VC to initiate scap depression and restraction.       Shoulder  Exercises: Pulleys   Flexion --  10 sec hold x 10    ABduction --  10 sec hold x 10      Shoulder Exercises: Therapy Ball   Other Therapy Ball Exercises scapular depression pressing into therapy ball 5 sec hold x 10 x 2 sets      Shoulder Exercises: Isometric Strengthening   Extension 5X10"   External Rotation 5X10"   ABduction 5X10"     Vasopneumatic   Number Minutes Vasopneumatic  15 minutes   Vasopnuematic Location  Shoulder  Rt   Vasopneumatic Pressure Low   Vasopneumatic Temperature  34 deg     Manual Therapy   Manual Therapy Passive ROM;Joint mobilization;Soft tissue mobilization   Soft tissue mobilization STM to Rt pec major/minor; biceps; upper trap; teres pt supine    Scapular Mobilization Rt    Passive ROM Rt shoulder flexion and ER                 PT Education - 03/29/17 1012    Education provided Yes   Education Details HEP   Person(s) Educated Patient   Methods Explanation;Demonstration;Tactile cues;Verbal cues;Handout   Comprehension Verbalized understanding;Returned demonstration;Verbal cues required;Tactile cues required          PT Short Term Goals -  03/29/17 0933      PT SHORT TERM GOAL #1   Title independent with initial HEP (04/05/17)   Time 4   Period Weeks   Status On-going     PT SHORT TERM GOAL #2   Title improve PROM Rt shoulder to full motion (within MD restrictions if still limited) for improved motion and function (04/05/17)   Time 4   Period Weeks   Status On-going           PT Long Term Goals - 03/29/17 2244      PT LONG TERM GOAL #1   Title independent with advanced HEP (05/31/17)   Time 12   Period Weeks   Status On-going     PT LONG TERM GOAL #2   Title improve AROM Rt shoulder to WNL for improved function and mobility (05/31/17)   Time 12   Period Weeks   Status On-going     PT LONG TERM GOAL #3   Title demonstrate at least 4/5 Rt shoulder strength for improved function and return to ADLs (05/31/17)   Time  12   Period Weeks   Status On-going     PT LONG TERM GOAL #4   Title perform golf simulated activities without pain or abnormal shoulder movements for return to recreational activities (05/31/17)   Time 12   Period Weeks   Status On-going               Plan - 03/29/17 0944    Clinical Impression Statement Out of sling with no problems. Added AROM exercises without difficulty. Continues to work on scapular position/posterior shoulder girdle strengthening; neuromuscular re-education. Progressing to six 6 phase of protocol. RTW date 05/02/17 with restirctions.    Rehab Potential Good   PT Frequency 2x / week   PT Duration 12 weeks   PT Treatment/Interventions ADLs/Self Care Home Management;Cryotherapy;Electrical Stimulation;Moist Heat;Ultrasound;Therapeutic exercise;Therapeutic activities;Patient/family education;Manual techniques;Scar mobilization;Passive range of motion;Vasopneumatic Device;Taping;Dry needling   PT Next Visit Plan  PROM/AAROM to tolerance assess response ER started today per protoco/MD visit 03/28/17, modalities as needed.        Patient will benefit from skilled therapeutic intervention in order to improve the following deficits and impairments:  Pain, Impaired UE functional use, Increased fascial restricitons, Decreased strength, Decreased range of motion, Postural dysfunction  Visit Diagnosis: Acute pain of right shoulder  Stiffness of right shoulder, not elsewhere classified  Abnormal posture  Muscle weakness (generalized)     Problem List Patient Active Problem List   Diagnosis Date Noted  . Steatohepatitis 12/15/2016  . Kidney cyst, acquired 12/07/2016  . Partial tear of right subscapularis tendon 09/15/2016  . Glenohumeral arthritis, right 09/15/2016  . Elevated C-reactive protein (CRP) 08/13/2016  . Lateral epicondylitis of both elbows 01/02/2016  . Patellofemoral arthralgia of both knees 10/31/2015  . Family history of prostate cancer  08/24/2013  . Insomnia 08/24/2013  . 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 History of sleep apnea 08/24/2013    Lezley Bedgood Nilda Simmer PT, MPH  03/29/2017, 10:17 AM  [facility] [place], [place], [address] Phone: [phone]   Fax:  [phone]  Name: Cristian Watkins MRN: [id number] Date of Birth: 1961-08-17

## 2017-03-29 NOTE — Patient Instructions (Signed)
Standing row starting with knees bent Keep elbows and hands close to body Bring elbows to back pocket   Isometrics 10 sec hold x 5 Push out with elbow Push out with wrist Push back with elbow  In pool arms forward, out to side, rotation 20 each

## 2017-03-31 ENCOUNTER — Encounter: Payer: 59 | Admitting: Physical Therapy

## 2017-04-03 ENCOUNTER — Other Ambulatory Visit: Payer: Self-pay | Admitting: Family Medicine

## 2017-04-05 ENCOUNTER — Ambulatory Visit (INDEPENDENT_AMBULATORY_CARE_PROVIDER_SITE_OTHER): Payer: 59 | Admitting: Physical Therapy

## 2017-04-05 DIAGNOSIS — M25511 Pain in right shoulder: Secondary | ICD-10-CM | POA: Diagnosis not present

## 2017-04-05 DIAGNOSIS — M25611 Stiffness of right shoulder, not elsewhere classified: Secondary | ICD-10-CM | POA: Diagnosis not present

## 2017-04-05 DIAGNOSIS — R293 Abnormal posture: Secondary | ICD-10-CM | POA: Diagnosis not present

## 2017-04-05 DIAGNOSIS — M6281 Muscle weakness (generalized): Secondary | ICD-10-CM | POA: Diagnosis not present

## 2017-04-05 NOTE — Therapy (Signed)
[place], [place], [address] Phone: [phone]   Fax:  [phone]  Physical Therapy Treatment  Patient Details  Name: Cristian Watkins MRN: [id number] Date of Birth: [birth year] Referring Provider: Dr. Tania Ade   Encounter Date: 04/05/2017      PT End of Session - 04/05/17 1013    Visit Number 8   Number of Visits 24   Date for PT Re-Evaluation 05/31/17   Authorization Type UHC - 30 visit limit   PT Start Time 0931   PT Stop Time 1029   PT Time Calculation (min) 58 min   Activity Tolerance Patient tolerated treatment well;No increased pain      Past Medical History:  Diagnosis Date  . Diabetes [facility])     Past Surgical History:  Procedure Laterality Date  . rotator cuff surgeries  1999,2010    There were no vitals filed for this visit.          [facility] PT Assessment - 04/05/17 0001      ROM / Strength   AROM / PROM / Strength AROM;PROM     AROM   AROM Assessment Site Shoulder   Right/Left Shoulder Right   Right Shoulder Flexion 130 Degrees  supine, with cane   Right Shoulder External Rotation 50 Degrees  supine, abducted to ~70 deg     Strength   Right Hand Grip (lbs) 96   Left Hand Grip (lbs) 92          [facility] Adult PT Treatment/Exercise - 04/05/17 0001      Elbow Exercises   Elbow Flexion Strengthening;Right;Seated;20 reps  Rt shoulder neutral, 15 reps with each wt   Bar Weights/Barbell (Elbow Flexion) 3 lbs;4 lbs     Shoulder Exercises: Supine   External Rotation AAROM;Right;10 reps  with cane, arm abdct ~30 deg   Flexion AAROM;Both;10 reps  cane     Shoulder Exercises: Prone   Horizontal ABduction 1 AROM;Strengthening;Right;10 reps   Horizontal ABduction 1 Limitations tactile cues for scap retraction   Other Prone Exercises prone row AAROM for scap retraction/depression x 5, then 10 reps on own.      Shoulder Exercises: Standing   Flexion  AROM;Right;10 reps  with scap squeeze of noodle; to 90 deg   ABduction AROM;Right;10 reps  scaption with noodle; to 90 deg    Row AROM;20 reps  arms at side w/ knee bent start moving to ext arms at side    Retraction 10 reps  back against pool noodle -10 sec hold     Shoulder Exercises: Pulleys   Flexion --  10 sec hold x 15    ABduction --  10 sec hold x 10      Vasopneumatic   Number Minutes Vasopneumatic  15 minutes   Vasopnuematic Location  Shoulder  Rt   Vasopneumatic Pressure Low   Vasopneumatic Temperature  34 deg                  PT Short Term Goals - 03/29/17 0933      PT SHORT TERM GOAL #1   Title independent with initial HEP (04/05/17)   Time 4   Period Weeks   Status On-going     PT SHORT TERM GOAL #2   Title improve PROM Rt shoulder to full motion (within MD restrictions if still limited) for improved motion and function (04/05/17)   Time 4   Period Weeks   Status On-going  PT Long Term Goals - 03/29/17 6184      PT LONG TERM GOAL #1   Title independent with advanced HEP (05/31/17)   Time 12   Period Weeks   Status On-going     PT LONG TERM GOAL #2   Title improve AROM Rt shoulder to WNL for improved function and mobility (05/31/17)   Time 12   Period Weeks   Status On-going     PT LONG TERM GOAL #3   Title demonstrate at least 4/5 Rt shoulder strength for improved function and return to ADLs (05/31/17)   Time 12   Period Weeks   Status On-going     PT LONG TERM GOAL #4   Title perform golf simulated activities without pain or abnormal shoulder movements for return to recreational activities (05/31/17)   Time 12   Period Weeks   Status On-going               Plan - 04/05/17 1313    Clinical Impression Statement Pt's Rt shoulder active and AAROM are improving each visit.  He tolerated all exercises with minimal increase in pain.  With tactile cues and repetition, pt was able to demonstrate improved scap retraction  /depression in prone position.  Progressing towards goals.    Rehab Potential Good   PT Frequency 2x / week   PT Duration 12 weeks   PT Treatment/Interventions ADLs/Self Care Home Management;Cryotherapy;Electrical Stimulation;Moist Heat;Ultrasound;Therapeutic exercise;Therapeutic activities;Patient/family education;Manual techniques;Scar mobilization;Passive range of motion;Vasopneumatic Device;Taping;Dry needling   PT Next Visit Plan  PROM/AAROM to tolerance; progress per protocol.    Consulted and Agree with Plan of Care Patient      Patient will benefit from skilled therapeutic intervention in order to improve the following deficits and impairments:  Pain, Impaired UE functional use, Increased fascial restricitons, Decreased strength, Decreased range of motion, Postural dysfunction  Visit Diagnosis: Acute pain of right shoulder  Stiffness of right shoulder, not elsewhere classified  Abnormal posture  Muscle weakness (generalized)     Problem List Patient Active Problem List   Diagnosis Date Noted  . Steatohepatitis 12/15/2016  . Kidney cyst, acquired 12/07/2016  . Partial tear of right subscapularis tendon 09/15/2016  . Glenohumeral arthritis, right 09/15/2016  . Elevated C-reactive protein (CRP) 08/13/2016  . Lateral epicondylitis of both elbows 01/02/2016  . Patellofemoral arthralgia of both knees 10/31/2015  . Family history of prostate cancer 08/24/2013  . Insomnia 08/24/2013  . 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 History of sleep apnea 08/24/2013   Kerin Perna, PTA 04/05/17 1:17 PM  [facility] [place], [place], [address] Phone: [phone]   Fax:  [phone]  Name: Cristian Watkins MRN: [id number] Date of Birth: 03-25-1961

## 2017-04-06 ENCOUNTER — Ambulatory Visit (INDEPENDENT_AMBULATORY_CARE_PROVIDER_SITE_OTHER): Payer: 59 | Admitting: Physical Therapy

## 2017-04-06 DIAGNOSIS — M25611 Stiffness of right shoulder, not elsewhere classified: Secondary | ICD-10-CM | POA: Diagnosis not present

## 2017-04-06 DIAGNOSIS — M25511 Pain in right shoulder: Secondary | ICD-10-CM

## 2017-04-06 DIAGNOSIS — M6281 Muscle weakness (generalized): Secondary | ICD-10-CM | POA: Diagnosis not present

## 2017-04-06 DIAGNOSIS — R293 Abnormal posture: Secondary | ICD-10-CM | POA: Diagnosis not present

## 2017-04-06 NOTE — Therapy (Signed)
[facility]  [place], [place], [address] Phone: [phone]   Fax:  [phone]  Physical Therapy Treatment  Patient Details  Name: Cristian Watkins MRN: [id number] Date of Birth: [birth year] Referring Provider: Dr. Tania Ade  Encounter Date: 04/06/2017      PT End of Session - 04/06/17 0936    Visit Number 9   Number of Visits 24   Date for PT Re-Evaluation 05/31/17   Authorization Type UHC - 30 visit limit   PT Start Time 0932   PT Stop Time 1033   PT Time Calculation (min) 61 min      Past Medical History:  Diagnosis Date  . Diabetes [facility]     Past Surgical History:  Procedure Laterality Date  . rotator cuff surgeries  1999,2010    There were no vitals filed for this visit.      Subjective Assessment - 04/06/17 0937    Subjective Cristian Watkins reports he was more sore when he woke up this morning. Wearing tennis shoes for first time since surgery.    Currently in Pain? Yes   Pain Score 3    Pain Location Shoulder   Pain Orientation Right   Pain Descriptors / Indicators Sore   Aggravating Factors  ?   Pain Relieving Factors ice            [facility] PT Assessment - 04/06/17 0001      Assessment   Medical Diagnosis Rt RTC repair   Referring Provider Dr. Tania Ade   Onset Date/Surgical Date 02/14/17   Hand Dominance Right   Next MD Visit 05/09/17          [facility] Adult PT Treatment/Exercise - 04/06/17 0001      Shoulder Exercises: Supine   Other Supine Exercises prolonged stretch with Rt shoulder scaption (shoulder supported by therapist)~85 deg x 2 min    Other Supine Exercises Rt hand behind head x 3 reps ( for AROM/stretch to Rt shoulder)     Shoulder Exercises: Prone   Other Prone Exercises prone row AAROM for scap retraction/depression x 10 reps (row to neutral)     Shoulder Exercises: Sidelying   External Rotation AROM;Right;10 reps  to ~5 deg past neutral. with scap  retraction.    Flexion AROM;Right;5 reps  fatigues quickly     Shoulder Exercises: Pulleys   Flexion --  10 sec hold x 15    ABduction --  10 sec hold x 10      Shoulder Exercises: Therapy Ball   Other Therapy Ball Exercises scapular depression pressing into therapy ball 5 sec hold x 10; then arm CW/CCW, ball roll forward/backward (focus on scapula positioning and control)      Vasopneumatic   Number Minutes Vasopneumatic  15 minutes   Vasopnuematic Location  Shoulder  Rt   Vasopneumatic Pressure Low   Vasopneumatic Temperature  34 deg     Manual Therapy   Soft tissue mobilization TPR to Rt subscap, pin and stretch (with Rt scapula anchored and Rt shoulder passively moved into scaption under 80 deg)    Myofascial Release bilat pec in standing, therapist pushing pt against pool noodle; pecs/anterior chest pt supine    Scapular Mobilization Rt in all directions.    Passive ROM Rt shoulder flexion, scaption, ER, IR      Electric stimulation for pain:  IFC, 15 min, to Rt shoulder, to pt tolerance.         PT Short  Term Goals - 03/29/17 0933      PT SHORT TERM GOAL #1   Title independent with initial HEP (04/05/17)   Time 4   Period Weeks   Status On-going     PT SHORT TERM GOAL #2   Title improve PROM Rt shoulder to full motion (within MD restrictions if still limited) for improved motion and function (04/05/17)   Time 4   Period Weeks   Status On-going           PT Long Term Goals - 03/29/17 1610      PT LONG TERM GOAL #1   Title independent with advanced HEP (05/31/17)   Time 12   Period Weeks   Status On-going     PT LONG TERM GOAL #2   Title improve AROM Rt shoulder to WNL for improved function and mobility (05/31/17)   Time 12   Period Weeks   Status On-going     PT LONG TERM GOAL #3   Title demonstrate at least 4/5 Rt shoulder strength for improved function and return to ADLs (05/31/17)   Time 12   Period Weeks   Status On-going     PT LONG TERM  GOAL #4   Title perform golf simulated activities without pain or abnormal shoulder movements for return to recreational activities (05/31/17)   Time 12   Period Weeks   Status On-going               Plan - 04/06/17 1231    Clinical Impression Statement Pt continues to require tactile cues for good initiation and sustained scapular retraction/depression.  AAROM in Rt shoulder improving each visit. Progressing gradually towards goals.    Rehab Potential Good   PT Frequency 2x / week   PT Duration 12 weeks   PT Treatment/Interventions ADLs/Self Care Home Management;Cryotherapy;Electrical Stimulation;Moist Heat;Ultrasound;Therapeutic exercise;Therapeutic activities;Patient/family education;Manual techniques;Scar mobilization;Passive range of motion;Vasopneumatic Device;Taping;Dry needling   PT Next Visit Plan  PROM/AAROM to tolerance; progress per protocol.    Consulted and Agree with Plan of Care Patient      Patient will benefit from skilled therapeutic intervention in order to improve the following deficits and impairments:  Pain, Impaired UE functional use, Increased fascial restricitons, Decreased strength, Decreased range of motion, Postural dysfunction  Visit Diagnosis: Acute pain of right shoulder  Stiffness of right shoulder, not elsewhere classified  Abnormal posture  Muscle weakness (generalized)     Problem List Patient Active Problem List   Diagnosis Date Noted  . Steatohepatitis 12/15/2016  . Kidney cyst, acquired 12/07/2016  . Partial tear of right subscapularis tendon 09/15/2016  . Glenohumeral arthritis, right 09/15/2016  . Elevated C-reactive protein (CRP) 08/13/2016  . Lateral epicondylitis of both elbows 01/02/2016  . Patellofemoral arthralgia of both knees 10/31/2015  . Family history of prostate cancer 08/24/2013  . Insomnia 08/24/2013  . 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 History of sleep apnea 08/24/2013    Kerin Perna, PTA 04/06/17 12:43 PM  [place], [place], [address] Phone: [phone]   Fax:  [phone]  Name: Cristian Watkins MRN: [id number] Date of Birth: 04-28-1961

## 2017-04-08 ENCOUNTER — Encounter: Payer: 59 | Admitting: Physical Therapy

## 2017-04-11 ENCOUNTER — Ambulatory Visit (INDEPENDENT_AMBULATORY_CARE_PROVIDER_SITE_OTHER): Payer: 59 | Admitting: Physical Therapy

## 2017-04-11 DIAGNOSIS — R293 Abnormal posture: Secondary | ICD-10-CM

## 2017-04-11 DIAGNOSIS — M25511 Pain in right shoulder: Secondary | ICD-10-CM | POA: Diagnosis not present

## 2017-04-11 DIAGNOSIS — M6281 Muscle weakness (generalized): Secondary | ICD-10-CM

## 2017-04-11 DIAGNOSIS — M25611 Stiffness of right shoulder, not elsewhere classified: Secondary | ICD-10-CM | POA: Diagnosis not present

## 2017-04-11 LAB — HM DIABETES EYE EXAM

## 2017-04-11 NOTE — Patient Instructions (Signed)
Strengthening: Isometric Flexion  Using wall for resistance, press right fist into ball using light pressure. Hold __10__ seconds. Repeat __10__ times per set. Do _1___ sets per session. Do ___2 _ sessions per day.  SHOULDER: Abduction (Isometric)  Use wall as resistance. Press arm against pillow. Keep elbow straight. Hold _10__ seconds. _10__ reps per set, __2_ sets per day, __7 days per week  Extension (Isometric)  Place left bent elbow and back of arm against wall. Press elbow against wall. Hold ___10_ seconds. Repeat __10__ times. Do _2___ sessions per day.  Internal Rotation (Isometric)  Place palm of right fist against door frame, with elbow bent. Press fist against door frame. Hold _10_ seconds. Repeat _10___ times. Do __2__ sessions per day.  External Rotation (Isometric)  Place back of left fist against door frame, with elbow bent. Press fist against door frame. Hold _10__ seconds. Repeat __10__ times. Do _2___ sessions per day.   * ball roll up wall, hold 5-10 seconds in stretch, roll back down. 5-10 reps.   [facility] [place], [place] [address]  [phone] (office) [phone] (fax)

## 2017-04-11 NOTE — Therapy (Signed)
[place], [place], [address] Phone: [phone]   Fax:  [phone]  Physical Therapy Treatment  Patient Details  Name: Cristian Watkins MRN: [id number] Date of Birth: [birth year] Referring Provider: Dr. Tania Ade  Encounter Date: 04/11/2017      PT End of Session - 04/11/17 0806    Visit Number 10   Number of Visits 24   Date for PT Re-Evaluation 05/31/17   Authorization Type UHC - 30 visit limit   PT Start Time 0801   PT Stop Time 0854   PT Time Calculation (min) 53 min   Activity Tolerance Patient tolerated treatment well;No increased pain   Behavior During Therapy WFL for tasks assessed/performed      Past Medical History:  Diagnosis Date  . Diabetes [facility])     Past Surgical History:  Procedure Laterality Date  . rotator cuff surgeries  1999,2010    There were no vitals filed for this visit.      Subjective Assessment - 04/11/17 0857    Subjective Pt reports he is almost back to his previous sleep level; only has slight pain when laying on his stomach/Rt side.    Currently in Pain? No/denies   Pain Score 0-No pain            [facility] PT Assessment - 04/11/17 0001      Assessment   Medical Diagnosis Rt RTC repair     AROM   AROM Assessment Site Shoulder   Right/Left Shoulder Right   Right Shoulder Extension 42 Degrees   Right Shoulder Flexion 128 Degrees  standing   Right Shoulder ABduction 112 Degrees   Right Shoulder External Rotation 55 Degrees  supine, abducted to ~70 deg         [facility] Adult PT Treatment/Exercise - 04/11/17 0001      Shoulder Exercises: Prone   Other Prone Exercises prone row AAROM for scap retraction/depression x 10 reps (row to neutral)     Shoulder Exercises: Standing   External Rotation AROM;Right;Left;10 reps  with noodle    Flexion AROM;Right;10 reps  with scap squeeze of noodle; to 100 deg   ABduction AROM;Right;10 reps   scaption with noodle; to 95 deg    Retraction 10 reps  back against pool noodle -10 sec hold   Retraction Limitations tactile cues and VC to initiate scap depression and restraction.     Other Standing Exercises manual pressure against shoulders with pt's back against pool noodle x 10 sec x 4 reps (stretching pec and encouraging scap retraction)     Shoulder Exercises: Pulleys   Flexion --  10 sec hold x 10   ABduction --  10 sec hold x 10      Shoulder Exercises: Therapy Ball   Flexion 10 reps  rolling ball up wall, to tolerance     Shoulder Exercises: Isometric Strengthening   Extension 5X5"   External Rotation 5X5"   Internal Rotation 5X5"   ABduction 5X5"     Vasopneumatic   Number Minutes Vasopneumatic  15 minutes   Vasopnuematic Location  Shoulder  Rt   Vasopneumatic Pressure Low   Vasopneumatic Temperature  34 deg                PT Education - 04/11/17 0831    Education provided Yes   Education Details HEP- isometrics    Person(s) Educated Patient   Methods Handout;Demonstration;Explanation   Comprehension Verbalized understanding;Returned demonstration  PT Short Term Goals - 03/29/17 5093      PT SHORT TERM GOAL #1   Title independent with initial HEP (04/05/17)   Time 4   Period Weeks   Status On-going     PT SHORT TERM GOAL #2   Title improve PROM Rt shoulder to full motion (within MD restrictions if still limited) for improved motion and function (04/05/17)   Time 4   Period Weeks   Status On-going           PT Long Term Goals - 03/29/17 2671      PT LONG TERM GOAL #1   Title independent with advanced HEP (05/31/17)   Time 12   Period Weeks   Status On-going     PT LONG TERM GOAL #2   Title improve AROM Rt shoulder to WNL for improved function and mobility (05/31/17)   Time 12   Period Weeks   Status On-going     PT LONG TERM GOAL #3   Title demonstrate at least 4/5 Rt shoulder strength for improved function and  return to ADLs (05/31/17)   Time 12   Period Weeks   Status On-going     PT LONG TERM GOAL #4   Title perform golf simulated activities without pain or abnormal shoulder movements for return to recreational activities (05/31/17)   Time 12   Period Weeks   Status On-going               Plan - 04/11/17 0845    Clinical Impression Statement Pt demonstrating improved Rt shoulder AROM.  He continues to require VC/tactile cues for improved scapulohumeral motion and posture.  He tolerated all exercises with min pain up to 3/10 during exertion.  He is making good progress towards established goals.    Rehab Potential Good   PT Frequency 2x / week   PT Duration 12 weeks   PT Treatment/Interventions ADLs/Self Care Home Management;Cryotherapy;Electrical Stimulation;Moist Heat;Ultrasound;Therapeutic exercise;Therapeutic activities;Patient/family education;Manual techniques;Scar mobilization;Passive range of motion;Vasopneumatic Device;Taping;Dry needling   PT Next Visit Plan Continue progressive Rt shoulder ROM/strengthening per MD protocol.  Modalities as indicated    Consulted and Agree with Plan of Care Patient      Patient will benefit from skilled therapeutic intervention in order to improve the following deficits and impairments:  Pain, Impaired UE functional use, Increased fascial restricitons, Decreased strength, Decreased range of motion, Postural dysfunction  Visit Diagnosis: Acute pain of right shoulder  Stiffness of right shoulder, not elsewhere classified  Abnormal posture  Muscle weakness (generalized)     Problem List Patient Active Problem List   Diagnosis Date Noted  . Steatohepatitis 12/15/2016  . Kidney cyst, acquired 12/07/2016  . Partial tear of right subscapularis tendon 09/15/2016  . Glenohumeral arthritis, right 09/15/2016  . Elevated C-reactive protein (CRP) 08/13/2016  . Lateral epicondylitis of both elbows 01/02/2016  . Patellofemoral arthralgia of  both knees 10/31/2015  . Family history of prostate cancer 08/24/2013  . Insomnia 08/24/2013  . Uncontrolled type 2 diabetes with renal manifestation ([place]) 08/24/2013  . History of sleep apnea 08/24/2013   Kerin Perna, PTA 04/11/17 9:01 AM  [facility] [place], [place], [address] Phone: [phone]   Fax:  [phone]  Name: Cristian Watkins MRN: [id number] Date of Birth: 12/05/1960

## 2017-04-13 ENCOUNTER — Encounter: Payer: 59 | Admitting: Physical Therapy

## 2017-04-15 ENCOUNTER — Ambulatory Visit (INDEPENDENT_AMBULATORY_CARE_PROVIDER_SITE_OTHER): Payer: 59 | Admitting: Physical Therapy

## 2017-04-15 DIAGNOSIS — M25611 Stiffness of right shoulder, not elsewhere classified: Secondary | ICD-10-CM | POA: Diagnosis not present

## 2017-04-15 DIAGNOSIS — R293 Abnormal posture: Secondary | ICD-10-CM | POA: Diagnosis not present

## 2017-04-15 DIAGNOSIS — M6281 Muscle weakness (generalized): Secondary | ICD-10-CM | POA: Diagnosis not present

## 2017-04-15 DIAGNOSIS — M25511 Pain in right shoulder: Secondary | ICD-10-CM

## 2017-04-15 NOTE — Therapy (Signed)
[place], [place], [address] Phone: [phone]   Fax:  [phone]  Physical Therapy Treatment  Patient Details  Name: Cristian Watkins MRN: [id number] Date of Birth: July 13, 1961 Referring Provider: Dr. Tania Ade  Encounter Date: 04/15/2017      PT End of Session - 04/15/17 1018    Visit Number 11   Number of Visits 24   Date for PT Re-Evaluation 05/31/17   Authorization Type UHC - 30 visit limit   PT Start Time 0930   PT Stop Time 1030   PT Time Calculation (min) 60 min   Activity Tolerance Patient tolerated treatment well   Behavior During Therapy [facility] for tasks assessed/performed      Past Medical History:  Diagnosis Date  . Diabetes [facility])     Past Surgical History:  Procedure Laterality Date  . rotator cuff surgeries  1999,2010    There were no vitals filed for this visit.      Subjective Assessment - 04/15/17 1306    Subjective Pt reports no new changes since last visit.    Currently in Pain? No/denies   Pain Score 0-No pain            [facility] PT Assessment - 04/15/17 0001      Assessment   Medical Diagnosis Rt RTC repair   Referring Provider Dr. Tania Ade   Onset Date/Surgical Date 02/14/17   Hand Dominance Right   Next MD Visit 05/09/17   Prior Therapy none for this surgery     AROM   Right Shoulder External Rotation 60 Degrees  supine, abducted to ~40 deg                     [facility] Adult PT Treatment/Exercise - 04/15/17 0001      Shoulder Exercises: Supine   Protraction --   Horizontal ABduction AAROM;Both;10 reps  cane; and horiz add   External Rotation AAROM;Right;10 reps  with cane, arm abdct ~30 deg   Flexion AAROM;Both;10 reps  cane   Other Supine Exercises prolonged stretch with Rt shoulder abd ~75 deg x 1 min    Other Supine Exercises Rt hand resting on forehead x 5 reps ( for AROM/stretch to Rt shoulder)     Shoulder  Exercises: Standing   Internal Rotation AAROM;Both;10 reps  1st set with cane up over buttocks, 2nd set belt assist   Extension Both;10 reps;AAROM  cane      Shoulder Exercises: Pulleys   Flexion --  10 sec hold x 10   ABduction --  10 sec hold x 10      Vasopneumatic   Number Minutes Vasopneumatic  15 minutes   Vasopnuematic Location  Shoulder  Rt   Vasopneumatic Pressure Low   Vasopneumatic Temperature  34 deg     Manual Therapy   Manual therapy comments Pt supine    Soft tissue mobilization TPR to Rt subscap, pin and stretch (with Rt scapula anchored and Rt shoulder passively moved into scaption)    Myofascial Release Rt pec release, Rt lat release    Scapular Mobilization Rt in all directions.    Passive ROM Rt shoulder flexion, scaption, ER, IR, Ext                   PT Short Term Goals - 03/29/17 7591      PT SHORT TERM GOAL #1   Title independent with initial HEP (04/05/17)  Time 4   Period Weeks   Status On-going     PT SHORT TERM GOAL #2   Title improve PROM Rt shoulder to full motion (within MD restrictions if still limited) for improved motion and function (04/05/17)   Time 4   Period Weeks   Status On-going           PT Long Term Goals - 03/29/17 7579      PT LONG TERM GOAL #1   Title independent with advanced HEP (05/31/17)   Time 12   Period Weeks   Status On-going     PT LONG TERM GOAL #2   Title improve AROM Rt shoulder to WNL for improved function and mobility (05/31/17)   Time 12   Period Weeks   Status On-going     PT LONG TERM GOAL #3   Title demonstrate at least 4/5 Rt shoulder strength for improved function and return to ADLs (05/31/17)   Time 12   Period Weeks   Status On-going     PT LONG TERM GOAL #4   Title perform golf simulated activities without pain or abnormal shoulder movements for return to recreational activities (05/31/17)   Time 12   Period Weeks   Status On-going               Plan - 04/15/17  7282    Clinical Impression Statement Pt 8.5 wks s/p Rt RTC surgery. Pt tolerated all exercises with slight increase in pain to 4/10; resolved with rest and vaso.  AROM and AAROM in Rt shoulder progressing nicely.  Pt progressing towards established goals.    Rehab Potential Good   PT Duration 12 weeks   PT Treatment/Interventions ADLs/Self Care Home Management;Cryotherapy;Electrical Stimulation;Moist Heat;Ultrasound;Therapeutic exercise;Therapeutic activities;Patient/family education;Manual techniques;Scar mobilization;Passive range of motion;Vasopneumatic Device;Taping;Dry needling   PT Next Visit Plan Continue progressive Rt shoulder ROM/strengthening per MD protocol.  Modalities as indicated    Consulted and Agree with Plan of Care Patient      Patient will benefit from skilled therapeutic intervention in order to improve the following deficits and impairments:  Pain, Impaired UE functional use, Increased fascial restricitons, Decreased strength, Decreased range of motion, Postural dysfunction  Visit Diagnosis: Acute pain of right shoulder  Stiffness of right shoulder, not elsewhere classified  Abnormal posture  Muscle weakness (generalized)     Problem List Patient Active Problem List   Diagnosis Date Noted  . Steatohepatitis 12/15/2016  . Kidney cyst, acquired 12/07/2016  . Partial tear of right subscapularis tendon 09/15/2016  . Glenohumeral arthritis, right 09/15/2016  . Elevated C-reactive protein (CRP) 08/13/2016  . Lateral epicondylitis of both elbows 01/02/2016  . Patellofemoral arthralgia of both knees 10/31/2015  . Family history of prostate cancer 08/24/2013  . Insomnia 08/24/2013  . 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 History of sleep apnea 08/24/2013   Kerin Perna, PTA 04/15/17 1:08 PM  [place] [place], [place], [address] Phone:  [phone]   Fax:  [phone]  Name: Cristian Watkins MRN: [id number] Date of Birth: [birth year]

## 2017-04-19 ENCOUNTER — Encounter: Payer: Self-pay | Admitting: Physical Therapy

## 2017-04-19 ENCOUNTER — Ambulatory Visit (INDEPENDENT_AMBULATORY_CARE_PROVIDER_SITE_OTHER): Payer: 59 | Admitting: Physical Therapy

## 2017-04-19 DIAGNOSIS — M25611 Stiffness of right shoulder, not elsewhere classified: Secondary | ICD-10-CM | POA: Diagnosis not present

## 2017-04-19 DIAGNOSIS — R293 Abnormal posture: Secondary | ICD-10-CM

## 2017-04-19 DIAGNOSIS — M25511 Pain in right shoulder: Secondary | ICD-10-CM | POA: Diagnosis not present

## 2017-04-19 DIAGNOSIS — M6281 Muscle weakness (generalized): Secondary | ICD-10-CM | POA: Diagnosis not present

## 2017-04-19 NOTE — Therapy (Signed)
[place], [place], [address] Phone: [phone]   Fax:  [phone]  Physical Therapy Treatment  Patient Details  Name: Cristian Watkins MRN: [id number] Date of Birth: 1961-02-04 Referring Provider: Dr Tamera Punt  Encounter Date: 04/19/2017      PT End of Session - 04/19/17 0822    Visit Number 12   Number of Visits 24   Date for PT Re-Evaluation 05/31/17   Authorization Type UHC - 30 visit limit   PT Start Time 0804   PT Stop Time 0859   PT Time Calculation (min) 55 min   Activity Tolerance Patient tolerated treatment well   Behavior During Therapy [facility] for tasks assessed/performed      Past Medical History:  Diagnosis Date  . Diabetes [facility])     Past Surgical History:  Procedure Laterality Date  . rotator cuff surgeries  1999,2010    There were no vitals filed for this visit.      Subjective Assessment - 04/19/17 0805    Subjective Pt reports he is doing pretty good.    Patient Stated Goals improve pain, return to golfing   Currently in Pain? No/denies            [facility] PT Assessment - 04/19/17 0001      Assessment   Medical Diagnosis Rt RTC repair   Referring Provider Dr Tamera Punt   Onset Date/Surgical Date 02/14/17   Hand Dominance Right   Next MD Visit 05/09/17     AROM   AROM Assessment Site Shoulder   Right/Left Shoulder Right   Right Shoulder Extension 58 Degrees   Right Shoulder Flexion 145 Degrees  seated   Right Shoulder ABduction 129 Degrees  seated   Right Shoulder Internal Rotation 88 Degrees   Right Shoulder External Rotation 57 Degrees                     [facility] Adult PT Treatment/Exercise - 04/19/17 0001      Shoulder Exercises: Standing   External Rotation Strengthening;Right;Theraband  2x8, towel under elbow   Theraband Level (Shoulder External Rotation) Level 1 (Yellow)   Internal Rotation Strengthening;Right;Theraband  2x8, towel  under elbow   Theraband Level (Shoulder Internal Rotation) Level 1 (Yellow)   Retraction Strengthening;Both;Theraband  30 reps, VC for form, elbows straight, and bent 90 degrees.     Shoulder Exercises: Pulleys   Flexion 3 minutes   ABduction 2 minutes  scaption     Shoulder Exercises: Stretch   Other Shoulder Stretches in door with strap, pecs, shoulder flex  very difficult with flexion   Other Shoulder Stretches supine hands behind head 20 reps, 5 sec.      Vasopneumatic   Number Minutes Vasopneumatic  15 minutes   Vasopnuematic Location  Shoulder   Vasopneumatic Pressure Low   Vasopneumatic Temperature  3*                PT Education - 04/19/17 0819    Education provided Yes   Education Details HEP - yellow band   Person(s) Educated Patient   Methods Explanation;Demonstration;Handout   Comprehension Returned demonstration;Verbalized understanding          PT Short Term Goals - 04/19/17 0822      PT SHORT TERM GOAL #1   Title independent with initial HEP (04/05/17)   Status Achieved     PT SHORT TERM GOAL #2   Title improve PROM Rt shoulder to full  motion (within MD restrictions if still limited) for improved motion and function (04/05/17)           PT Long Term Goals - 04/19/17 7841      PT LONG TERM GOAL #1   Title independent with advanced HEP (05/31/17)   Status On-going     PT LONG TERM GOAL #2   Title improve AROM Rt shoulder to WNL for improved function and mobility (05/31/17)   Status On-going     PT LONG TERM GOAL #3   Title demonstrate at least 4/5 Rt shoulder strength for improved function and return to ADLs (05/31/17)   Status On-going     PT LONG TERM GOAL #4   Title perform golf simulated activities without pain or abnormal shoulder movements for return to recreational activities (05/31/17)   Status On-going               Plan - 04/19/17 0841    Clinical Impression Statement Cristian Watkins tolerated treatment well, he had good form  for new HEP after instruction.  ROM in the Rt shoulder is improving. met some STGs and making progress to the others.    Rehab Potential Good   PT Frequency 2x / week   PT Duration 12 weeks   PT Treatment/Interventions ADLs/Self Care Home Management;Cryotherapy;Electrical Stimulation;Moist Heat;Ultrasound;Therapeutic exercise;Therapeutic activities;Patient/family education;Manual techniques;Scar mobilization;Passive range of motion;Vasopneumatic Device;Taping;Dry needling   PT Next Visit Plan Continue progressive Rt shoulder ROM/strengthening per MD protocol.  Modalities as indicated    Consulted and Agree with Plan of Care Patient      Patient will benefit from skilled therapeutic intervention in order to improve the following deficits and impairments:  Pain, Impaired UE functional use, Increased fascial restricitons, Decreased strength, Decreased range of motion, Postural dysfunction  Visit Diagnosis: Acute pain of right shoulder  Stiffness of right shoulder, not elsewhere classified  Abnormal posture  Muscle weakness (generalized)     Problem List Patient Active Problem List   Diagnosis Date Noted  . Steatohepatitis 12/15/2016  . Kidney cyst, acquired 12/07/2016  . Partial tear of right subscapularis tendon 09/15/2016  . Glenohumeral arthritis, right 09/15/2016  . Elevated C-reactive protein (CRP) 08/13/2016  . Lateral epicondylitis of both elbows 01/02/2016  . Patellofemoral arthralgia of both knees 10/31/2015  . Family history of prostate cancer 08/24/2013  . Insomnia 08/24/2013  . 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 History of sleep apnea 08/24/2013    Jeral Pinch PT  04/19/2017, 8:44 AM  [facility] [place], [place], [address] Phone: [phone]   Fax:  [phone]  Name: Cristian Watkins MRN: [id number] Date of Birth: 1961/06/19

## 2017-04-19 NOTE — Patient Instructions (Addendum)
Strengthening: Resisted External Rotation - towel under elbow    Hold tubing in right hand, elbow at side and forearm across body. Rotate forearm out. Repeat _8-10___ times per set. Do _2-3___ sets per session. Do __1__ sessions per day.  Strengthening: Resisted Internal Rotation - towel under elbow    Hold tubing in left hand, elbow at side and forearm out. Rotate forearm in across body. Repeat _8-10___ times per set. Do _2-3___ sets per session. Do __1__ sessions per day.  EXTENSION: Standing - Resistance Band: Stable (Active) - perform with both arms   Stand, both arms at side. Against yellow resistance band, draw arm backward, as far as possible, keeping elbow straight. Squeeze your shoulder blades together.  Complete _3__ sets of _10__ repetitions. Perform _1__ sessions per day.  Resistive Band Rowing   With resistive band anchored in door, grasp both ends. Keeping elbows bent, pull back, squeezing shoulder blades together. Hold ___1-2_ seconds. Repeat _10___ times, 3 sets. Do __1__ sessions per day.

## 2017-04-21 ENCOUNTER — Ambulatory Visit (INDEPENDENT_AMBULATORY_CARE_PROVIDER_SITE_OTHER): Payer: 59 | Admitting: Physical Therapy

## 2017-04-21 DIAGNOSIS — M25511 Pain in right shoulder: Secondary | ICD-10-CM | POA: Diagnosis not present

## 2017-04-21 DIAGNOSIS — M25611 Stiffness of right shoulder, not elsewhere classified: Secondary | ICD-10-CM

## 2017-04-21 DIAGNOSIS — R293 Abnormal posture: Secondary | ICD-10-CM | POA: Diagnosis not present

## 2017-04-21 DIAGNOSIS — M6281 Muscle weakness (generalized): Secondary | ICD-10-CM

## 2017-04-21 NOTE — Therapy (Signed)
[place], [place], [address] Phone: [phone]   Fax:  [phone]  Physical Therapy Treatment  Patient Details  Name: Cristian Watkins MRN: [id number] Date of Birth: Sep 18, 1961 Referring Provider: Dr Tamera Punt  Encounter Date: 04/21/2017      PT End of Session - 04/21/17 0851    Visit Number 13   Number of Visits 24   Date for PT Re-Evaluation 05/31/17   Authorization Type UHC - 30 visit limit   PT Start Time 0801   PT Stop Time 0900   PT Time Calculation (min) 59 min      Past Medical History:  Diagnosis Date  . Diabetes [facility]     Past Surgical History:  Procedure Laterality Date  . rotator cuff surgeries  1999,2010    There were no vitals filed for this visit.      Subjective Assessment - 04/21/17 0855    Subjective Pt reports he was a little sore after last session, but it resolved during day.  Pt reports he is anxious to get back to golfing, and has a goal of washing Lt armpit with Rt arm (still difficult).    Currently in Pain? No/denies   Pain Score 0-No pain            [facility] PT Assessment - 04/21/17 0001      Assessment   Medical Diagnosis Rt RTC repair          [facility] Adult PT Treatment/Exercise - 04/21/17 0001      Shoulder Exercises: Supine   Protraction AROM;Right;10 reps  tactile cues to assist retraction     Shoulder Exercises: Prone   Horizontal ABduction 1 AROM;Strengthening;Right;10 reps   Other Prone Exercises prone row AAROM for scap retraction/depression x 10 reps (row to neutral)     Shoulder Exercises: Sidelying   External Rotation AROM;Right;5 reps, then 8 reps with 1#, slow and controlled.       Shoulder Exercises: Standing   Internal Rotation AAROM;Both;12 reps  cane     Shoulder Exercises: Pulleys   Flexion 3 minutes   ABduction 2 minutes  scaption     Shoulder Exercises: Therapy Ball   Flexion 5 reps  rolling ball up wall, to  tolerance- 5-10 sec hold   Other Therapy Ball Exercises Rt shoulder flexion to ~80 deg, small circles CW/CCW, scaption to 65 deg CW/CCW      Shoulder Exercises: Stretch   Other Shoulder Stretches Lat stretch x 15 sec x 3 reps     Vasopneumatic   Number Minutes Vasopneumatic  15 minutes   Vasopnuematic Location  Shoulder   Vasopneumatic Pressure Low   Vasopneumatic Temperature  3*     Manual Therapy   Soft tissue mobilization TPR to Rt infraspinatus; Rt subscap, pin and stretch (with Rt scapula anchored and Rt shoulder passively moved into scaption)    Myofascial Release Rt pec release, Rt lat release    Passive ROM Rt shoulder scaption, flexion, ER                  PT Short Term Goals - 04/19/17 1324      PT SHORT TERM GOAL #1   Title independent with initial HEP (04/05/17)   Status Achieved     PT SHORT TERM GOAL #2   Title improve PROM Rt shoulder to full motion (within MD restrictions if still limited) for improved motion and function (04/05/17)  PT Long Term Goals - 04/19/17 9169      PT LONG TERM GOAL #1   Title independent with advanced HEP (05/31/17)   Status On-going     PT LONG TERM GOAL #2   Title improve AROM Rt shoulder to WNL for improved function and mobility (05/31/17)   Status On-going     PT LONG TERM GOAL #3   Title demonstrate at least 4/5 Rt shoulder strength for improved function and return to ADLs (05/31/17)   Status On-going     PT LONG TERM GOAL #4   Title perform golf simulated activities without pain or abnormal shoulder movements for return to recreational activities (05/31/17)   Status On-going               Plan - 04/21/17 0857    Clinical Impression Statement Pt tolerated all exercises well, with minimal increase in pain. Posterior shoulder girdle activation/sustained engagement is requiring less tactile cues. Pt progressing well towards stated goals within given protocol.    Rehab Potential Good   PT Frequency  2x / week   PT Duration 12 weeks   PT Treatment/Interventions ADLs/Self Care Home Management;Cryotherapy;Electrical Stimulation;Moist Heat;Ultrasound;Therapeutic exercise;Therapeutic activities;Patient/family education;Manual techniques;Scar mobilization;Passive range of motion;Vasopneumatic Device;Taping;Dry needling   PT Next Visit Plan Continue progressive Rt shoulder ROM/strengthening per MD protocol.  Modalities as indicated    Consulted and Agree with Plan of Care Patient      Patient will benefit from skilled therapeutic intervention in order to improve the following deficits and impairments:  Pain, Impaired UE functional use, Increased fascial restricitons, Decreased strength, Decreased range of motion, Postural dysfunction  Visit Diagnosis: Acute pain of right shoulder  Stiffness of right shoulder, not elsewhere classified  Abnormal posture  Muscle weakness (generalized)     Problem List Patient Active Problem List   Diagnosis Date Noted  . Steatohepatitis 12/15/2016  . Kidney cyst, acquired 12/07/2016  . Partial tear of right subscapularis tendon 09/15/2016  . Glenohumeral arthritis, right 09/15/2016  . Elevated C-reactive protein (CRP) 08/13/2016  . Lateral epicondylitis of both elbows 01/02/2016  . Patellofemoral arthralgia of both knees 10/31/2015  . Family history of prostate cancer 08/24/2013  . Insomnia 08/24/2013  . 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 History of sleep apnea 08/24/2013   Kerin Perna, PTA 04/21/17 11:46 AM\ [facility] [place], [place], [address] Phone: [phone]   Fax:  [phone]  Name: Cristian Watkins MRN: [id number] Date of Birth: Nov 09, 1961

## 2017-04-26 ENCOUNTER — Ambulatory Visit (INDEPENDENT_AMBULATORY_CARE_PROVIDER_SITE_OTHER): Payer: 59 | Admitting: Physical Therapy

## 2017-04-26 DIAGNOSIS — M25511 Pain in right shoulder: Secondary | ICD-10-CM | POA: Diagnosis not present

## 2017-04-26 DIAGNOSIS — M25611 Stiffness of right shoulder, not elsewhere classified: Secondary | ICD-10-CM | POA: Diagnosis not present

## 2017-04-26 DIAGNOSIS — R293 Abnormal posture: Secondary | ICD-10-CM | POA: Diagnosis not present

## 2017-04-26 DIAGNOSIS — M6281 Muscle weakness (generalized): Secondary | ICD-10-CM | POA: Diagnosis not present

## 2017-04-26 NOTE — Therapy (Signed)
[place], [place], [address] Phone: [phone]   Fax:  [phone]  Physical Therapy Treatment  Patient Details  Name: Cristian Watkins MRN: [id number] Date of Birth: [birth year] Referring Provider: Dr. Tamera Punt  Encounter Date: 04/26/2017      PT End of Session - 04/26/17 0818    Visit Number 14   Number of Visits 24   Date for PT Re-Evaluation 05/31/17   Authorization Type UHC - 30 visit limit   PT Start Time 0800   PT Stop Time 0901   PT Time Calculation (min) 61 min      Past Medical History:  Diagnosis Date  . Diabetes [facility])     Past Surgical History:  Procedure Laterality Date  . rotator cuff surgeries  1999,2010    There were no vitals filed for this visit.      Subjective Assessment - 04/26/17 0836    Subjective Pt reports he will be returning to work on light duty on Monday, allowed to lift 10#. He is expected to be 100% full duty in 30 days from 05/02/17.  He is almost able to wash his Lt armpit with Rt hand.  He's been focusing on ROM exercises at home.    Patient Stated Goals improve pain, return to golfing   Currently in Pain? No/denies   Pain Score 0-No pain   Pain Location Shoulder   Pain Orientation Right            [facility] PT Assessment - 04/26/17 0001      Assessment   Medical Diagnosis Rt RTC repair   Referring Provider Dr. Tamera Punt   Onset Date/Surgical Date 02/14/17   Hand Dominance Right   Next MD Visit 05/11/17     Precautions   Precaution Comments see protocol     AROM   Right Shoulder Flexion 141 Degrees   Right Shoulder ABduction 135 Degrees  standing   Right Shoulder Internal Rotation --  to SI joint, behind back   Right Shoulder External Rotation 63 Degrees  supine, abdct ~70 deg     PROM   Right Shoulder Flexion 145 Degrees  supine, with cane          [facility] Adult PT Treatment/Exercise - 04/26/17 0001      Shoulder Exercises: Supine    Horizontal ABduction AAROM;Both;10 reps  cane; and horiz add   External Rotation Strengthening;Both;10 reps;Theraband  2 sets, tactile cues for positioning. cane AAROM x 10 prior.   Theraband Level (Shoulder External Rotation) Level 1 (Yellow)   Flexion AAROM;Both;5 reps  cane   Other Supine Exercises prolonged stretch with Rt shoulder abd ~95 deg x 1 min, hooklying on noodle.      Shoulder Exercises: Sidelying   External Rotation Right;10 reps;Weights   External Rotation Weight (lbs) 1   ABduction Strengthening;Right;10 reps;Weights   ABduction Weight (lbs) 1     Shoulder Exercises: Standing   Internal Rotation AAROM;Both;12 reps  cane   Theraband Level (Shoulder Internal Rotation) Level 1 (Yellow)  10 reps, RUE, 2 sets   Flexion Strengthening;Right;10 reps;Theraband  [place] 4, 2 sets   Theraband Level (Shoulder Flexion) Level 1 (Yellow)   Extension Right;10 reps;Theraband  2 sets   Theraband Level (Shoulder Extension) Level 1 (Yellow)  to neutral, tactile cues for technique   Row Strengthening;Right;Theraband;10 reps  2 sets   Theraband Level (Shoulder Row) Level 1 (Yellow)   Other Standing Exercises floor to waist lift of  10# sack with Rt arm x 4 reps - pt given VC for safe technique.      Shoulder Exercises: Pulleys   Flexion 3 minutes   ABduction 3 minutes     Shoulder Exercises: Stretch   Other Shoulder Stretches Lat stretch x 15 sec x 2 reps     Vasopneumatic   Number Minutes Vasopneumatic  15 minutes   Vasopnuematic Location  Shoulder   Vasopneumatic Pressure Low   Vasopneumatic Temperature  3*                PT Education - 04/26/17 1310    Education provided Yes   Education Details reviewed band HEP.    Person(s) Educated Patient   Methods Explanation   Comprehension Verbalized understanding          PT Short Term Goals - 04/19/17 1610      PT SHORT TERM GOAL #1   Title independent with initial HEP (04/05/17)   Status Achieved      PT SHORT TERM GOAL #2   Title improve PROM Rt shoulder to full motion (within MD restrictions if still limited) for improved motion and function (04/05/17)           PT Long Term Goals - 04/19/17 9604      PT LONG TERM GOAL #1   Title independent with advanced HEP (05/31/17)   Status On-going     PT LONG TERM GOAL #2   Title improve AROM Rt shoulder to WNL for improved function and mobility (05/31/17)   Status On-going     PT LONG TERM GOAL #3   Title demonstrate at least 4/5 Rt shoulder strength for improved function and return to ADLs (05/31/17)   Status On-going     PT LONG TERM GOAL #4   Title perform golf simulated activities without pain or abnormal shoulder movements for return to recreational activities (05/31/17)   Status On-going               Plan - 04/26/17 1309    Clinical Impression Statement Pt's Rt shoulder ROM improving each session.  He tolerated initiation of resistance/strengthening exercises with minimal increase in pain. Pt progressing well towards states goals within protocol.    Rehab Potential Good   PT Frequency 2x / week   PT Duration 12 weeks   PT Treatment/Interventions ADLs/Self Care Home Management;Cryotherapy;Electrical Stimulation;Moist Heat;Ultrasound;Therapeutic exercise;Therapeutic activities;Patient/family education;Manual techniques;Scar mobilization;Passive range of motion;Vasopneumatic Device;Taping;Dry needling   PT Next Visit Plan Continue progressive Rt shoulder ROM/strengthening per MD protocol.  Modalities as indicated    Consulted and Agree with Plan of Care Patient      Patient will benefit from skilled therapeutic intervention in order to improve the following deficits and impairments:  Pain, Impaired UE functional use, Increased fascial restricitons, Decreased strength, Decreased range of motion, Postural dysfunction  Visit Diagnosis: Acute pain of right shoulder  Stiffness of right shoulder, not elsewhere  classified  Abnormal posture  Muscle weakness (generalized)     Problem List Patient Active Problem List   Diagnosis Date Noted  . Steatohepatitis 12/15/2016  . Kidney cyst, acquired 12/07/2016  . Partial tear of right subscapularis tendon 09/15/2016  . Glenohumeral arthritis, right 09/15/2016  . Elevated C-reactive protein (CRP) 08/13/2016  . Lateral epicondylitis of both elbows 01/02/2016  . Patellofemoral arthralgia of both knees 10/31/2015  . Family history of prostate cancer 08/24/2013  . Insomnia 08/24/2013  . 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  History of sleep apnea 08/24/2013   Kerin Perna, PTA 04/26/17 1:15 PM  [facility] [place], [place], [address] Phone: [phone]   Fax:  [phone]  Name: SUVAN STCYR MRN: [id number] Date of Birth: 1961-03-29

## 2017-04-28 ENCOUNTER — Other Ambulatory Visit: Payer: Self-pay | Admitting: Family Medicine

## 2017-04-28 ENCOUNTER — Ambulatory Visit (INDEPENDENT_AMBULATORY_CARE_PROVIDER_SITE_OTHER): Payer: 59 | Admitting: Physical Therapy

## 2017-04-28 DIAGNOSIS — R293 Abnormal posture: Secondary | ICD-10-CM

## 2017-04-28 DIAGNOSIS — M25611 Stiffness of right shoulder, not elsewhere classified: Secondary | ICD-10-CM | POA: Diagnosis not present

## 2017-04-28 DIAGNOSIS — M25511 Pain in right shoulder: Secondary | ICD-10-CM

## 2017-04-28 DIAGNOSIS — M6281 Muscle weakness (generalized): Secondary | ICD-10-CM

## 2017-04-28 NOTE — Therapy (Signed)
[place], [place], [address] Phone: [phone]   Fax:  [phone]  Physical Therapy Treatment  Patient Details  Name: Cristian Watkins MRN: [id number] Date of Birth: 04-26-1961 Referring Provider: Dr. Tamera Punt  Encounter Date: 04/28/2017      PT End of Session - 04/28/17 0824    Visit Number 15   Number of Visits 24   Date for PT Re-Evaluation 05/31/17   Authorization Type UHC - 30 visit limit   PT Start Time 0802   PT Stop Time 0901   PT Time Calculation (min) 59 min   Activity Tolerance Patient tolerated treatment well   Behavior During Therapy [facility] for tasks assessed/performed      Past Medical History:  Diagnosis Date  . Diabetes [facility])     Past Surgical History:  Procedure Laterality Date  . rotator cuff surgeries  1999,2010    There were no vitals filed for this visit.      Subjective Assessment - 04/28/17 0824    Subjective Pt reports he is getting closer to washing his Lt armpit with Rt hand. Otherwise no new changes since last visit.    Currently in Pain? No/denies   Pain Score 0-No pain            [facility] PT Assessment - 04/28/17 0001      Assessment   Medical Diagnosis Rt RTC repair   Referring Provider Dr. Tamera Punt   Onset Date/Surgical Date 02/14/17   Hand Dominance Right   Next MD Visit 05/11/17           [facility] Adult PT Treatment/Exercise - 04/28/17 0001      Elbow Exercises   Elbow Flexion Strengthening;15 reps;Standing   Bar Weights/Barbell (Elbow Flexion) 5 lbs     Shoulder Exercises: Prone   Other Prone Exercises prone row with retraction/depression (AAROM x 5 reps) x 10 reps.  then prone on green therapy ball on top of black table with 1-2# wt bilat with 2-3 sec hold x 5 reps      Shoulder Exercises: Standing   Flexion AROM;Right;15 reps  tactile cues for scap retraction, noodle behind back x 10    Flexion Limitations 5 reps of reaching over head  shelf lifting cup.    Extension Both;10 reps;Theraband   Theraband Level (Shoulder Extension) Level 2 (Red)   Row Both;10 reps;Theraband   Theraband Level (Shoulder Row) Level 2 (Red)     Shoulder Exercises: Pulleys   Flexion 2 minutes   ABduction 2 minutes     Shoulder Exercises: ROM/Strengthening   Wall Pushups 10 reps     Shoulder Exercises: Stretch   Corner Stretch 2 reps;20 seconds   Cross Chest Stretch 2 reps;20 seconds  RUE   Internal Rotation Stretch 3 reps  standing, hand behind back   Other Shoulder Stretches Lat stretch x 15 sec x 3 reps   Other Shoulder Stretches trial of doorway stretch, low and middle position, elbow straight and elbow bent x 20 sec x 2 reps each      Vasopneumatic   Number Minutes Vasopneumatic  15 minutes   Vasopnuematic Location  Shoulder   Vasopneumatic Pressure Low   Vasopneumatic Temperature  3*           PT Short Term Goals - 04/28/17 1001      PT SHORT TERM GOAL #1   Title independent with initial HEP (04/05/17)   Time 4   Period Weeks  Status Achieved     PT SHORT TERM GOAL #2   Title improve PROM Rt shoulder to full motion (within MD restrictions if still limited) for improved motion and function (04/05/17)   Time 4   Period Weeks   Status On-going  progressing            PT Long Term Goals - 04/28/17 1002      PT LONG TERM GOAL #1   Title independent with advanced HEP (05/31/17)   Time 12   Period Weeks   Status On-going     PT LONG TERM GOAL #2   Title improve AROM Rt shoulder to WNL for improved function and mobility (05/31/17)   Time 12   Period Weeks   Status On-going     PT LONG TERM GOAL #3   Title demonstrate at least 4/5 Rt shoulder strength for improved function and return to ADLs (05/31/17)   Time 12   Period Weeks   Status On-going     PT LONG TERM GOAL #4   Title perform golf simulated activities without pain or abnormal shoulder movements for return to recreational activities (05/31/17)   Time  12   Period Weeks   Status On-going               Plan - 04/28/17 0955    Clinical Impression Statement Pt demonstrated improved prone scap retraction; requires active assistance initially, but then able to complete exercise well on own.  Pt tolerated most exercises well, reporting some discomfort in ant Rt shoulder with horiz add, return to neutral from flexion above 90 deg, and shoulder IR stretch.  Pt making good gains towards goals.    Rehab Potential Good   PT Frequency 2x / week   PT Duration 12 weeks   PT Treatment/Interventions ADLs/Self Care Home Management;Cryotherapy;Electrical Stimulation;Moist Heat;Ultrasound;Therapeutic exercise;Therapeutic activities;Patient/family education;Manual techniques;Scar mobilization;Passive range of motion;Vasopneumatic Device;Taping;Dry needling   PT Next Visit Plan Passive stretching of Rt shoulder; Continue progressive Rt shoulder ROM/strengthening per MD protocol.  Modalities as indicated    Consulted and Agree with Plan of Care Patient      Patient will benefit from skilled therapeutic intervention in order to improve the following deficits and impairments:  Pain, Impaired UE functional use, Increased fascial restricitons, Decreased strength, Decreased range of motion, Postural dysfunction  Visit Diagnosis: Acute pain of right shoulder  Stiffness of right shoulder, not elsewhere classified  Abnormal posture  Muscle weakness (generalized)     Problem List Patient Active Problem List   Diagnosis Date Noted  . Steatohepatitis 12/15/2016  . Kidney cyst, acquired 12/07/2016  . Partial tear of right subscapularis tendon 09/15/2016  . Glenohumeral arthritis, right 09/15/2016  . Elevated C-reactive protein (CRP) 08/13/2016  . Lateral epicondylitis of both elbows 01/02/2016  . Patellofemoral arthralgia of both knees 10/31/2015  . Family history of prostate cancer 08/24/2013  . Insomnia 08/24/2013  . Uncontrolled type 2 diabetes  with renal manifestation ([place]) 08/24/2013  . History of sleep apnea 08/24/2013   Kerin Perna, PTA 04/28/17 10:03 AM  [place], [place], [address] Phone: [phone]   Fax:  [phone]  Name: Cristian Watkins MRN: [id number] Date of Birth: 1961/05/07

## 2017-04-29 ENCOUNTER — Encounter: Payer: Self-pay | Admitting: Family Medicine

## 2017-05-02 ENCOUNTER — Other Ambulatory Visit: Payer: Self-pay | Admitting: Family Medicine

## 2017-05-02 ENCOUNTER — Encounter: Payer: Self-pay | Admitting: Rehabilitative and Restorative Service Providers"

## 2017-05-02 ENCOUNTER — Ambulatory Visit (INDEPENDENT_AMBULATORY_CARE_PROVIDER_SITE_OTHER): Payer: 59 | Admitting: Rehabilitative and Restorative Service Providers"

## 2017-05-02 ENCOUNTER — Encounter: Payer: 59 | Admitting: Rehabilitative and Restorative Service Providers"

## 2017-05-02 DIAGNOSIS — M25511 Pain in right shoulder: Secondary | ICD-10-CM

## 2017-05-02 DIAGNOSIS — M25611 Stiffness of right shoulder, not elsewhere classified: Secondary | ICD-10-CM

## 2017-05-02 DIAGNOSIS — R293 Abnormal posture: Secondary | ICD-10-CM | POA: Diagnosis not present

## 2017-05-02 DIAGNOSIS — M6281 Muscle weakness (generalized): Secondary | ICD-10-CM

## 2017-05-02 NOTE — Therapy (Signed)
[place], [place], [address] Phone: [phone]   Fax:  [phone]  Physical Therapy Treatment  Patient Details  Name: Cristian Watkins MRN: [id number] Date of Birth: 1961-05-05 Referring Provider: Dr. Tamera Punt  Encounter Date: 05/02/2017      PT End of Session - 05/02/17 1601    Visit Number 16   Number of Visits 24   Date for PT Re-Evaluation 05/31/17   Authorization Type UHC - 30 visit limit   PT Start Time 1601   PT Stop Time 1655   PT Time Calculation (min) 54 min   Activity Tolerance Patient tolerated treatment well      Past Medical History:  Diagnosis Date  . Diabetes [facility])     Past Surgical History:  Procedure Laterality Date  . rotator cuff surgeries  1999,2010    There were no vitals filed for this visit.      Subjective Assessment - 05/02/17 1603    Subjective Back at work today - sitting all day doing training that he had missed. No increase in pain. Still can't reach over with Rt UE to wash under Lt arm. Feels the ROM is his biggest limitation now.    Currently in Pain? No/denies                         [facility] Adult PT Treatment/Exercise - 05/02/17 0001      Shoulder Exercises: Pulleys   Flexion 2 minutes   ABduction 2 minutes     Shoulder Exercises: Stretch   Cross Chest Stretch 2 reps;20 seconds  RUE   Internal Rotation Stretch 3 reps  standing, hand behind back   Other Shoulder Stretches biceps stretch 30 sec x 2; prayer lat stretch 30 sec x 2    Other Shoulder Stretches doorway stretch, three positions 30 sec x 2 reps each      Vasopneumatic   Number Minutes Vasopneumatic  15 minutes   Vasopnuematic Location  Shoulder   Vasopneumatic Pressure Low   Vasopneumatic Temperature  3*     Manual Therapy   Manual therapy comments Pt supine and Lt sidelying    Joint Mobilization GH joint mobs   Soft tissue mobilization lats/teres/middle and  lower traps; upper trap; pecs; biceps Rt    Myofascial Release Rt lateral trunk    Scapular Mobilization Rt pt sidelying    Passive ROM Rt shoudler horizontal abduction; adduction; ER; IR                 PT Education - 05/02/17 1612    Education Details HEP    Person(s) Educated Patient   Methods Explanation;Demonstration;Tactile cues;Verbal cues;Handout   Comprehension Verbalized understanding;Returned demonstration;Verbal cues required;Tactile cues required          PT Short Term Goals - 04/28/17 1001      PT SHORT TERM GOAL #1   Title independent with initial HEP (04/05/17)   Time 4   Period Weeks   Status Achieved     PT SHORT TERM GOAL #2   Title improve PROM Rt shoulder to full motion (within MD restrictions if still limited) for improved motion and function (04/05/17)   Time 4   Period Weeks   Status On-going  progressing            PT Long Term Goals - 04/28/17 1002      PT LONG TERM GOAL #1   Title independent  with advanced HEP (05/31/17)   Time 12   Period Weeks   Status On-going     PT LONG TERM GOAL #2   Title improve AROM Rt shoulder to WNL for improved function and mobility (05/31/17)   Time 12   Period Weeks   Status On-going     PT LONG TERM GOAL #3   Title demonstrate at least 4/5 Rt shoulder strength for improved function and return to ADLs (05/31/17)   Time 12   Period Weeks   Status On-going     PT LONG TERM GOAL #4   Title perform golf simulated activities without pain or abnormal shoulder movements for return to recreational activities (05/31/17)   Time 12   Period Weeks   Status On-going               Plan - 05/02/17 1649    Clinical Impression Statement Worked on joint mobilization and PROM; scapular mobilitzation; stretching. Good increase in functional reach across body. Progressing well toward goals of therapy.    Rehab Potential Good   PT Frequency 2x / week   PT Duration 12 weeks   PT Treatment/Interventions  ADLs/Self Care Home Management;Cryotherapy;Electrical Stimulation;Moist Heat;Ultrasound;Therapeutic exercise;Therapeutic activities;Patient/family education;Manual techniques;Scar mobilization;Passive range of motion;Vasopneumatic Device;Taping;Dry needling   PT Next Visit Plan Continue progressive Rt shoulder ROM/stretching/strengthening per MD protocol.  Modalities as indicated    Consulted and Agree with Plan of Care Patient      Patient will benefit from skilled therapeutic intervention in order to improve the following deficits and impairments:  Pain, Impaired UE functional use, Increased fascial restricitons, Decreased strength, Decreased range of motion, Postural dysfunction  Visit Diagnosis: Acute pain of right shoulder  Stiffness of right shoulder, not elsewhere classified  Abnormal posture  Muscle weakness (generalized)     Problem List Patient Active Problem List   Diagnosis Date Noted  . Steatohepatitis 12/15/2016  . Kidney cyst, acquired 12/07/2016  . Partial tear of right subscapularis tendon 09/15/2016  . Glenohumeral arthritis, right 09/15/2016  . Elevated C-reactive protein (CRP) 08/13/2016  . Lateral epicondylitis of both elbows 01/02/2016  . Patellofemoral arthralgia of both knees 10/31/2015  . Family history of prostate cancer 08/24/2013  . Insomnia 08/24/2013  . 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) 08/24/2013  . History of sleep apnea 08/24/2013    Rozina Pointer Nilda Simmer PT, MPH  05/02/2017, 4:52 PM  [facility] [place], [place], [address] Phone: [phone]   Fax:  [phone]  Name: [name] DELPIZZO MRN: [id number] Date of Birth: 02-Jan-1961

## 2017-05-02 NOTE — Patient Instructions (Addendum)
Scapula Adduction With Pectoralis Stretch: Low - Standing   Shoulders at 45 hands even with shoulders, keeping weight through legs, shift weight forward until you feel pull or stretch through the front of your chest. Hold _30__ seconds. Do _3__ times, _2-4__ times per day.   Scapula Adduction With Pectoralis Stretch: Mid-Range - Standing   Shoulders at 90 elbows even with shoulders, keeping weight through legs, shift weight forward until you feel pull or strength through the front of your chest. Hold __30_ seconds. Do _3__ times, __2-4_ times per day.   Scapula Adduction With Pectoralis Stretch: High - Standing   Shoulders at 120 hands up high on the doorway, keeping weight on feet, shift weight forward until you feel pull or stretch through the front of your chest. Hold _30__ seconds. Do _3__ times, _2-3__ times per day.   ELBOW: Biceps - Standing    Standing in doorway, place one hand on wall, elbow straight. Lean forward. Hold _30__ seconds. _3 reps per set, _2  per day

## 2017-05-03 ENCOUNTER — Encounter: Payer: 59 | Admitting: Physical Therapy

## 2017-05-05 ENCOUNTER — Ambulatory Visit (INDEPENDENT_AMBULATORY_CARE_PROVIDER_SITE_OTHER): Payer: 59 | Admitting: Physical Therapy

## 2017-05-05 DIAGNOSIS — M25611 Stiffness of right shoulder, not elsewhere classified: Secondary | ICD-10-CM

## 2017-05-05 DIAGNOSIS — M6281 Muscle weakness (generalized): Secondary | ICD-10-CM

## 2017-05-05 DIAGNOSIS — M25511 Pain in right shoulder: Secondary | ICD-10-CM | POA: Diagnosis not present

## 2017-05-05 DIAGNOSIS — R293 Abnormal posture: Secondary | ICD-10-CM

## 2017-05-05 NOTE — Therapy (Signed)
[place], [place], [address] Phone: [phone]   Fax:  [phone]  Physical Therapy Treatment  Patient Details  Name: Cristian Watkins MRN: [id number] Date of Birth: 04/21/1961 Referring Provider: Dr. Tamera Punt   Encounter Date: 05/05/2017      PT End of Session - 05/05/17 0852    Visit Number 17   Number of Visits 24   Date for PT Re-Evaluation 05/31/17   Authorization Type UHC - 30 visit limit   PT Start Time 0803   PT Stop Time 0901   PT Time Calculation (min) 58 min   Activity Tolerance Patient tolerated treatment well   Behavior During Therapy [facility] for tasks assessed/performed      Past Medical History:  Diagnosis Date  . Diabetes [facility])     Past Surgical History:  Procedure Laterality Date  . rotator cuff surgeries  1999,2010    There were no vitals filed for this visit.      Subjective Assessment - 05/05/17 0807    Subjective Pt is planning a golfing trip first week of August at [facility]; wants his shoulder to be ready for it.  He was a little sore after last session, but he can now wash his Lt armpit with Rt hand.     Currently in Pain? No/denies   Pain Score 0-No pain   Pain Location Shoulder            [facility] PT Assessment - 05/05/17 0001      Assessment   Medical Diagnosis Rt RTC repair   Referring Provider Dr. Tamera Punt    Onset Date/Surgical Date 02/14/17   Hand Dominance Right   Next MD Visit 05/13/17     AROM   Right/Left Shoulder Right   Right Shoulder Flexion 147 Degrees  standing   Right Shoulder ABduction 147 Degrees  standing   Right Shoulder External Rotation 62 Degrees     PROM   Right/Left Shoulder Right   Right Shoulder External Rotation 74 Degrees          [facility] Adult PT Treatment/Exercise - 05/05/17 0001      Shoulder Exercises: Pulleys   Flexion 2 minutes  10 sec holds   ABduction 2 minutes  10 sec holds     Shoulder Exercises:  Stretch   Cross Chest Stretch 2 reps;20 seconds  RUE   Internal Rotation Stretch 3 reps  standing, hand behind back   Other Shoulder Stretches biceps stretch 30 sec x 3; prayer lat stretch 30 sec x 3 (high kneeling, arms resting on elevated high low table, forward flexion of shoulder, supinated forearm)    Other Shoulder Stretches doorway stretch, three positions 30 sec x 2 reps each      Vasopneumatic   Number Minutes Vasopneumatic  15 minutes   Vasopnuematic Location  Shoulder   Vasopneumatic Pressure Low   Vasopneumatic Temperature  3*     Manual Therapy   Soft tissue mobilization Edge tool assistance to Rt lats/teres major/middle and lower traps; upper trap; pecs; biceps Rt - to decrease fascial restriction and improve shoulder ROM   Scapular Mobilization Rt scapula, pt sidelying    Passive ROM Rt shoulder ext, horiz abdct,  with ER            PT Short Term Goals - 04/28/17 1001      PT SHORT TERM GOAL #1   Title independent with initial HEP (04/05/17)   Time 4  Period Weeks   Status Achieved     PT SHORT TERM GOAL #2   Title improve PROM Rt shoulder to full motion (within MD restrictions if still limited) for improved motion and function (04/05/17)   Time 4   Period Weeks   Status On-going  progressing            PT Long Term Goals - 04/28/17 1002      PT LONG TERM GOAL #1   Title independent with advanced HEP (05/31/17)   Time 12   Period Weeks   Status On-going     PT LONG TERM GOAL #2   Title improve AROM Rt shoulder to WNL for improved function and mobility (05/31/17)   Time 12   Period Weeks   Status On-going     PT LONG TERM GOAL #3   Title demonstrate at least 4/5 Rt shoulder strength for improved function and return to ADLs (05/31/17)   Time 12   Period Weeks   Status On-going     PT LONG TERM GOAL #4   Title perform golf simulated activities without pain or abnormal shoulder movements for return to recreational activities (05/31/17)   Time 12    Period Weeks   Status On-going               Plan - 05/05/17 0825    Clinical Impression Statement Good response to manual therapy last session; pt reporting improved function with ADLs. He tolerated treatment well, with decrease in stiffness/soreness at end of session.   Pt continues to make good gains each visit.     Rehab Potential Good   PT Frequency 2x / week   PT Duration 12 weeks   PT Treatment/Interventions ADLs/Self Care Home Management;Cryotherapy;Electrical Stimulation;Moist Heat;Ultrasound;Therapeutic exercise;Therapeutic activities;Patient/family education;Manual techniques;Scar mobilization;Passive range of motion;Vasopneumatic Device;Taping;Dry needling   PT Next Visit Plan MD note prior to appt on 6/22.   Continue progressive ROM/strengthening.  contract/relax with manual therapy.    Consulted and Agree with Plan of Care Patient      Patient will benefit from skilled therapeutic intervention in order to improve the following deficits and impairments:  Pain, Impaired UE functional use, Increased fascial restricitons, Decreased strength, Decreased range of motion, Postural dysfunction  Visit Diagnosis: Acute pain of right shoulder  Stiffness of right shoulder, not elsewhere classified  Abnormal posture  Muscle weakness (generalized)     Problem List Patient Active Problem List   Diagnosis Date Noted  . Steatohepatitis 12/15/2016  . Kidney cyst, acquired 12/07/2016  . Partial tear of right subscapularis tendon 09/15/2016  . Glenohumeral arthritis, right 09/15/2016  . Elevated C-reactive protein (CRP) 08/13/2016  . Lateral epicondylitis of both elbows 01/02/2016  . Patellofemoral arthralgia of both knees 10/31/2015  . Family history of prostate cancer 08/24/2013  . Insomnia 08/24/2013  . Uncontrolled type 2 diabetes with renal manifestation ([place]) 08/24/2013  . History of sleep apnea 08/24/2013   Kerin Perna, PTA 05/05/17 3:20 PM  [facility] [place], [place], [address] Phone: [phone]   Fax:  [phone]  Name: Cristian Watkins MRN: [id number] Date of Birth: [birth year]

## 2017-05-09 ENCOUNTER — Ambulatory Visit (INDEPENDENT_AMBULATORY_CARE_PROVIDER_SITE_OTHER): Payer: 59 | Admitting: Rehabilitative and Restorative Service Providers"

## 2017-05-09 ENCOUNTER — Encounter: Payer: Self-pay | Admitting: Rehabilitative and Restorative Service Providers"

## 2017-05-09 DIAGNOSIS — M25611 Stiffness of right shoulder, not elsewhere classified: Secondary | ICD-10-CM | POA: Diagnosis not present

## 2017-05-09 DIAGNOSIS — M6281 Muscle weakness (generalized): Secondary | ICD-10-CM | POA: Diagnosis not present

## 2017-05-09 DIAGNOSIS — R293 Abnormal posture: Secondary | ICD-10-CM

## 2017-05-09 DIAGNOSIS — M25511 Pain in right shoulder: Secondary | ICD-10-CM | POA: Diagnosis not present

## 2017-05-09 NOTE — Patient Instructions (Signed)
Posterior Capsule Sleeper Stretch, Side-Lying    Lie on side, pillow under head, neck in neutral, underside arm in 90-90 of shoulder and elbow flexion with scapula fixed to table. Use other hand to press back of underside arm forward and downward. Keep elbow angle. Hold 30___ seconds.  Repeat _3__ times per session. Do _1-2__ sessions per day.  KEEP SWIM noodle or rolled towel under arm just above the elbow   Stepping under large ball elbows straight arms at ears Hold 30 sec 3-5 reps   "hug" the wall then turn toward Lt with Rt shoulder and arm along the wall  Lying on side Rt arm back with hand behind head push right arm and upper body back and right hip and lower body forward  Reverse and repeat   It is very important to stretch in doorway in high position   Scapula Adduction With Pectoralis Stretch: High - Standing   Shoulders at 120 hands up high on the doorway, keeping weight on feet, shift weight forward until you feel pull or stretch through the front of your chest. Hold _30__ seconds. Do _3__ times, _2-3__ times per day.

## 2017-05-09 NOTE — Therapy (Signed)
[place] [employer] [place] [place], [place], [address] Phone: [phone]   Fax:  [phone]  Physical Therapy Treatment  Patient Details  Name: Cristian Watkins MRN: [id number] Date of Birth: 06-03-1961 Referring Provider: Dr. Tamera Punt   Encounter Date: 05/09/2017      PT End of Session - 05/09/17 1610    Visit Number 18   Number of Visits 24   Date for PT Re-Evaluation 05/31/17   Authorization Type UHC - 30 visit limit   PT Start Time 1602   PT Stop Time 1700   PT Time Calculation (min) 58 min   Equipment Utilized During Treatment Gait belt      Past Medical History:  Diagnosis Date  . Diabetes [facility])     Past Surgical History:  Procedure Laterality Date  . rotator cuff surgeries  1999,2010    There were no vitals filed for this visit.      Subjective Assessment - 05/09/17 1612    Subjective Work is going well - still able to limit lifting and use of Rt UE but will need to increase use to full duty July 11th.    Currently in Pain? No/denies                         [facility] Adult PT Treatment/Exercise - 05/09/17 0001      Shoulder Exercises: Supine   Other Supine Exercises prolonged stretch with Rt shoulder abd ~95 deg x 1 min, hooklying on noodle.    Other Supine Exercises hands behind head for stretch 30 sec x 3      Shoulder Exercises: Sidelying   Internal Rotation --  sleeper stretch 30 sec x 3 noodle under distal arm elbow 90    Other Sidelying Exercises hand behind head for stretch adding posterior rotation of upper body and anterior rotatioin of trunk 3-5 reps   Other Sidelying Exercises passive stretch hand on hip for rotation and hand on head for rotation PT assist 30-45 sec hold x 3 each      Shoulder Exercises: Standing   Other Standing Exercises hands on wall at 90 deg abd for trunk rotation to Lt to stretch anterior Rt shoulder 30 sec x 3      Shoulder Exercises: Pulleys   Flexion 2 minutes  10 sec holds   ABduction 2 minutes  10 sec holds     Shoulder Exercises: Stretch   External Rotation Stretch --  supine and sidelying   Other Shoulder Stretches biceps stretch 30 sec x 3; prayer lat stretch 30 sec x 3 (high kneeling, arms resting on elevated high low table, forward flexion of shoulder, supinated forearm); stepping under large green ball for shd flexion stretch    Other Shoulder Stretches doorway stretch, three positions 30 sec x 2 reps each   progressing to higher position to stretch pec minor     Vasopneumatic   Number Minutes Vasopneumatic  15 minutes   Vasopnuematic Location  Shoulder   Vasopneumatic Pressure Low   Vasopneumatic Temperature  3*     Manual Therapy   Manual therapy comments Pt supine and Lt sidelying    Joint Mobilization GH joint mobs   Soft tissue mobilization lats teres middle and lower traps; serratus Rt with pt Lt sidelying    Myofascial Release Rt lateral trunk    Scapular Mobilization Rt scapula, pt sidelying    Passive ROM Rt shoulder ext, horiz abdct,  with ER                 PT Education - 05/09/17 1659    Education provided Yes   Education Details HEP    Person(s) Educated Patient   Methods Explanation;Demonstration;Tactile cues;Verbal cues;Handout   Comprehension Verbalized understanding;Returned demonstration;Verbal cues required;Tactile cues required          PT Short Term Goals - 04/28/17 1001      PT SHORT TERM GOAL #1   Title independent with initial HEP (04/05/17)   Time 4   Period Weeks   Status Achieved     PT SHORT TERM GOAL #2   Title improve PROM Rt shoulder to full motion (within MD restrictions if still limited) for improved motion and function (04/05/17)   Time 4   Period Weeks   Status On-going  progressing            PT Long Term Goals - 05/09/17 1612      PT LONG TERM GOAL #1   Title independent with advanced HEP (05/31/17)   Time 12   Period Weeks   Status On-going      PT LONG TERM GOAL #2   Title improve AROM Rt shoulder to WNL for improved function and mobility (05/31/17)   Time 12   Period Weeks   Status On-going     PT LONG TERM GOAL #3   Title demonstrate at least 4/5 Rt shoulder strength for improved function and return to ADLs (05/31/17)   Time 12   Period Weeks   Status On-going     PT LONG TERM GOAL #4   Title perform golf simulated activities without pain or abnormal shoulder movements for return to recreational activities (05/31/17)   Time 12   Period Weeks   Status On-going               Plan - 05/09/17 1614    Clinical Impression Statement Cotninued end range tightness and limitation. Responds well to more aggressive PROM and stretching. Gradually progressing toward stated goals of therapy.    Rehab Potential Good   PT Frequency 2x / week   PT Duration 12 weeks   PT Treatment/Interventions ADLs/Self Care Home Management;Cryotherapy;Electrical Stimulation;Moist Heat;Ultrasound;Therapeutic exercise;Therapeutic activities;Patient/family education;Manual techniques;Scar mobilization;Passive range of motion;Vasopneumatic Device;Taping;Dry needling   PT Next Visit Plan MD note prior to appt on 6/22.   Continue progressive ROM/strengthening.  contract/relax with manual therapy. Note to MD at next visit    Consulted and Agree with Plan of Care Patient      Patient will benefit from skilled therapeutic intervention in order to improve the following deficits and impairments:  Pain, Impaired UE functional use, Increased fascial restricitons, Decreased strength, Decreased range of motion, Postural dysfunction  Visit Diagnosis: Acute pain of right shoulder  Stiffness of right shoulder, not elsewhere classified  Abnormal posture  Muscle weakness (generalized)     Problem List Patient Active Problem List   Diagnosis Date Noted  . Steatohepatitis 12/15/2016  . Kidney cyst, acquired 12/07/2016  . Partial tear of right  subscapularis tendon 09/15/2016  . Glenohumeral arthritis, right 09/15/2016  . Elevated C-reactive protein (CRP) 08/13/2016  . Lateral epicondylitis of both elbows 01/02/2016  . Patellofemoral arthralgia of both knees 10/31/2015  . Family history of prostate cancer 08/24/2013  . Insomnia 08/24/2013  . 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 History of sleep apnea 08/24/2013    Mckinnon Glick Nilda Simmer PT, MPH  05/09/2017,  5:06 PM  [facility] [place], [place], [address] Phone: [phone]   Fax:  [phone]  Name: JATHEN SUDANO MRN: [id number] Date of Birth: [birth year]

## 2017-05-12 ENCOUNTER — Ambulatory Visit (INDEPENDENT_AMBULATORY_CARE_PROVIDER_SITE_OTHER): Payer: 59 | Admitting: Rehabilitative and Restorative Service Providers"

## 2017-05-12 ENCOUNTER — Encounter: Payer: Self-pay | Admitting: Rehabilitative and Restorative Service Providers"

## 2017-05-12 DIAGNOSIS — M25611 Stiffness of right shoulder, not elsewhere classified: Secondary | ICD-10-CM | POA: Diagnosis not present

## 2017-05-12 DIAGNOSIS — M6281 Muscle weakness (generalized): Secondary | ICD-10-CM | POA: Diagnosis not present

## 2017-05-12 DIAGNOSIS — M25511 Pain in right shoulder: Secondary | ICD-10-CM

## 2017-05-12 DIAGNOSIS — R293 Abnormal posture: Secondary | ICD-10-CM | POA: Diagnosis not present

## 2017-05-12 NOTE — Therapy (Signed)
[place], [place], [address] Phone: [phone]   Fax:  [phone]  Physical Therapy Treatment  Patient Details  Name: Cristian Watkins MRN: [id number] Date of Birth: 1961-10-26 Referring Provider: Dr Tamera Punt   Encounter Date: 05/12/2017      PT End of Session - 05/12/17 1108    Visit Number 19   Number of Visits 24   Date for PT Re-Evaluation 05/31/17   Authorization Type UHC - 30 visit limit   PT Start Time 1104   PT Stop Time 1221   PT Time Calculation (min) 77 min   Activity Tolerance Patient tolerated treatment well      Past Medical History:  Diagnosis Date  . Diabetes [facility])     Past Surgical History:  Procedure Laterality Date  . rotator cuff surgeries  1999,2010    There were no vitals filed for this visit.      Subjective Assessment - 05/12/17 1110    Subjective Work is going well - still able to limit lifting and use of Rt UE but will need to increase use to full duty July 11th. No pain. Working on exercises at home.    Currently in Pain? No/denies            [facility] PT Assessment - 05/12/17 0001      Assessment   Medical Diagnosis Rt RTC repair   Referring Provider Dr Tamera Punt    Onset Date/Surgical Date 02/14/17   Hand Dominance Right   Next MD Visit 05/13/17     AROM   AROM Assessment Site --  shoulder ROM assessed in standing    Right/Left Shoulder Right   Right Shoulder Extension 57 Degrees   Right Shoulder Flexion 150 Degrees   Right Shoulder ABduction 146 Degrees   Right Shoulder Internal Rotation 38 Degrees  shd 90 deg abduction    Right Shoulder External Rotation 90 Degrees  shoulder 90 deg abduction      PROM   Right/Left Shoulder Right  assessed with pt in supine    Right Shoulder Extension 62 Degrees   Right Shoulder Flexion 154 Degrees   Right Shoulder ABduction 158 Degrees   Right Shoulder Internal Rotation 44 Degrees   Right Shoulder  External Rotation 90 Degrees     Strength   Right/Left Shoulder --  assessed w/ pt in sitting - some discomfort w/ resisted test   Right Shoulder Flexion 4-/5   Right Shoulder Extension 5/5   Right Shoulder ABduction 4-/5   Right Shoulder Internal Rotation 4+/5   Right Shoulder External Rotation 4/5     Palpation   Palpation comment tightness noted whtout pecs; upper trap; leveator; teres; lats Rt UE                      [facility] Adult PT Treatment/Exercise - 05/12/17 0001      Shoulder Exercises: Supine   Other Supine Exercises prolonged stretch with Rt shoulder abd ~95 deg x 1 min, hooklying on noodle.    Other Supine Exercises hands behind head for stretch 30 sec x 3      Shoulder Exercises: Seated   Other Seated Exercises scapular retraction w/ scapular depression pressing into the large therapy ball; rhythmic stabilization on the ball 30 sec x 3 reps      Shoulder Exercises: Sidelying   Internal Rotation --  sleeper stretch 30 sec x 3 noodle under distal arm elbow 90  Other Sidelying Exercises hand behind head for stretch adding posterior rotation of upper body and anterior rotatioin of trunk 3-5 reps   Other Sidelying Exercises passive stretch hand on hip for rotation and hand on head for rotation PT assist 30-45 sec hold x 3 each      Shoulder Exercises: Standing   Other Standing Exercises hands on wall at 90 deg abd for trunk rotation to Lt to stretch anterior Rt shoulder 30 sec x 3      Shoulder Exercises: Pulleys   Flexion 2 minutes  10 sec holds   ABduction 2 minutes  10 sec holds     Shoulder Exercises: Therapy Ball   Flexion Limitations stepping under large ball for shd flexion stretch x 5    Other Therapy Ball Exercises Rt shoulder flexion to ~80 deg, small circles CW/CCW, scaption to 65 deg CW/CCW    Other Therapy Ball Exercises Rt UE in 90 deg shd/elbow flexion ball at dorsum of hand for strengthening isometric work and small circles with ball on  wall      Shoulder Exercises: Stretch   Other Shoulder Stretches biceps stretch 30 sec x 3; prayer lat stretch 30 sec x 3 (high kneeling, arms resting on elevated high low table, forward flexion of shoulder, supinated forearm); stepping under large green ball for shd flexion stretch    Other Shoulder Stretches doorway stretch, three positions 30 sec x 2 reps each   progressing to higher position to stretch pec minor     Vasopneumatic   Number Minutes Vasopneumatic  15 minutes   Vasopnuematic Location  Shoulder   Vasopneumatic Pressure Low   Vasopneumatic Temperature  3*     Manual Therapy   Manual therapy comments Pt supine and Lt sidelying    Joint Mobilization GH joint mobs   Soft tissue mobilization lats teres middle and lower traps; serratus Rt with pt Lt sidelying    Myofascial Release Rt lateral trunk    Scapular Mobilization Rt scapula, pt sidelying    Passive ROM Rt shoulder ext, horiz abdct,  with ER                 PT Education - 05/12/17 1129    Education provided Yes   Education Details HEP    Person(s) Educated Patient   Methods Explanation;Demonstration;Tactile cues;Verbal cues;Handout   Comprehension Verbalized understanding;Returned demonstration;Verbal cues required;Tactile cues required          PT Short Term Goals - 04/28/17 1001      PT SHORT TERM GOAL #1   Title independent with initial HEP (04/05/17)   Time 4   Period Weeks   Status Achieved     PT SHORT TERM GOAL #2   Title improve PROM Rt shoulder to full motion (within MD restrictions if still limited) for improved motion and function (04/05/17)   Time 4   Period Weeks   Status On-going  progressing            PT Long Term Goals - 05/12/17 1138      PT LONG TERM GOAL #1   Title independent with advanced HEP (05/31/17)   Time 12   Period Weeks   Status On-going     PT LONG TERM GOAL #2   Title improve AROM Rt shoulder to WNL for improved function and mobility (05/31/17)    Time 12   Period Weeks   Status On-going     PT LONG TERM GOAL #3   Title  demonstrate at least 4/5 Rt shoulder strength for improved function and return to ADLs (05/31/17)   Time 12   Status Achieved     PT LONG TERM GOAL #4   Title perform golf simulated activities without pain or abnormal shoulder movements for return to recreational activities (05/31/17)   Time 12   Period Weeks   Status On-going               Plan - 05/12/17 1130    Clinical Impression Statement Patient demonstrates good progress during the course of therapy. He demonstrates increasing ROM and mobilty through the Rt shoulder and gradually increasing strength and function. Catalina Antigua continues to have poor scapular control with weakness through middle trap. lower trap and serratus. He continues to have scapular dyskinesis with UE movement. Catalina Antigua will benefit form continued PT to address poor body mechanics, weakness and limited function.     Rehab Potential Good   PT Frequency 2x / week   PT Treatment/Interventions ADLs/Self Care Home Management;Cryotherapy;Electrical Stimulation;Moist Heat;Ultrasound;Therapeutic exercise;Therapeutic activities;Patient/family education;Manual techniques;Scar mobilization;Passive range of motion;Vasopneumatic Device;Taping;Dry needling   PT Next Visit Plan Continue progressive ROM/strengthening.  contract/relax with manual therapy. Note to MD    Consulted and Agree with Plan of Care Patient      Patient will benefit from skilled therapeutic intervention in order to improve the following deficits and impairments:  Pain, Impaired UE functional use, Increased fascial restricitons, Decreased strength, Decreased range of motion, Postural dysfunction  Visit Diagnosis: Acute pain of right shoulder  Stiffness of right shoulder, not elsewhere classified  Abnormal posture  Muscle weakness (generalized)     Problem List Patient Active Problem List   Diagnosis Date Noted  .  Steatohepatitis 12/15/2016  . Kidney cyst, acquired 12/07/2016  . Partial tear of right subscapularis tendon 09/15/2016  . Glenohumeral arthritis, right 09/15/2016  . Elevated C-reactive protein (CRP) 08/13/2016  . Lateral epicondylitis of both elbows 01/02/2016  . Patellofemoral arthralgia of both knees 10/31/2015  . Family history of prostate cancer 08/24/2013  . Insomnia 08/24/2013  . 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 History of sleep apnea 08/24/2013    [place] Nilda Simmer PT, MPH  05/12/2017, 12:15 PM  [facility] [place], [place], [address] Phone: [phone]   Fax:  [phone]  Name: AYDEN HARDWICK MRN: [id number] Date of Birth: Aug 17, 1961

## 2017-05-12 NOTE — Patient Instructions (Addendum)
Strengthening: Isometric Horizontal Abduction    Using wall for resistance, press outside of left arm into ball using light pressure.  elbow bent 90. Hold __30__ seconds. Repeat __5 times per set. Do __2-3__ sets per session. Do __1-2__ sessions per day.

## 2017-05-16 ENCOUNTER — Ambulatory Visit (INDEPENDENT_AMBULATORY_CARE_PROVIDER_SITE_OTHER): Payer: 59 | Admitting: Physical Therapy

## 2017-05-16 DIAGNOSIS — M25611 Stiffness of right shoulder, not elsewhere classified: Secondary | ICD-10-CM

## 2017-05-16 DIAGNOSIS — M6281 Muscle weakness (generalized): Secondary | ICD-10-CM | POA: Diagnosis not present

## 2017-05-16 DIAGNOSIS — R293 Abnormal posture: Secondary | ICD-10-CM

## 2017-05-16 DIAGNOSIS — M25511 Pain in right shoulder: Secondary | ICD-10-CM

## 2017-05-16 NOTE — Patient Instructions (Signed)
Over Head Pull: Narrow Grip     [place] [phone]   On back, knees bent, feet flat, band across thighs, elbows straight but relaxed. Pull hands apart (start). Keeping elbows straight, bring arms up and over head, hands toward floor. Keep pull steady on band. Hold momentarily. Return slowly, keeping pull steady, back to start. Repeat _10__ times. Band color ___yellow__   Side Pull: Double Arm   On back, knees bent, feet flat. Arms perpendicular to body, shoulder level, elbows straight but relaxed. Pull arms out to sides, elbows straight. Resistance band comes across collarbones, hands toward floor. Hold momentarily. Slowly return to starting position. Repeat _10__ times, 2 sets. Band color _yellow or green____   Sash   On back, knees bent, feet flat, left hand on left hip, right hand above left. Pull right arm DIAGONALLY (hip to shoulder) across chest. Bring right arm along head toward floor. Hold momentarily. Slowly return to starting position. Repeat _10__ times, 2 . Do with left arm. Band color _green_____   Shoulder Rotation: Double Arm   On back, knees bent, feet flat, elbows tucked at sides, bent 90, hands palms up. Pull hands apart and down toward floor, keeping elbows near sides. Hold momentarily. Slowly return to starting position. Repeat _10__ times, 2 sets. Band color __yellow____   [facility] [place], [place] [address]  [phone] (office) [phone] (fax)

## 2017-05-16 NOTE — Therapy (Signed)
[place], [place], [address] Phone: [phone]   Fax:  [phone]  Physical Therapy Treatment  Patient Details  Name: Cristian Watkins MRN: [id number] Date of Birth: 04/27/1961 Referring Provider: Dr. Tamera Punt  Encounter Date: 05/16/2017      PT End of Session - 05/16/17 1650    Visit Number 20   Number of Visits 24   Date for PT Re-Evaluation 05/31/17   Authorization Type UHC - 30 visit limit   PT Start Time 1603   PT Stop Time 1700   PT Time Calculation (min) 57 min   Activity Tolerance Patient tolerated treatment well   Behavior During Therapy [facility] for tasks assessed/performed      Past Medical History:  Diagnosis Date  . Diabetes [facility])     Past Surgical History:  Procedure Laterality Date  . rotator cuff surgeries  1999,2010    There were no vitals filed for this visit.      Subjective Assessment - 05/16/17 1609    Subjective Pt saw MD on Friday; he said he can do less stretching and more strengthening at PT.  He got the go ahead to do more strengthening for Rt Shoulder, including racquetball (with Lt hand), and golf swing with short clubs (wedges, not drivers).   It is painful in posterior Rt shoulder to swing club and to reach behind back (up to 4/10, resolved with rest).     Patient Stated Goals improve pain, return to golfing   Currently in Pain? No/denies   Pain Score 0-No pain            [facility] PT Assessment - 05/16/17 0001      Assessment   Medical Diagnosis Rt RTC repair   Referring Provider Dr. Tamera Punt   Onset Date/Surgical Date 02/14/17   Hand Dominance Right   Next MD Visit 06/22/17         [facility] Adult PT Treatment/Exercise - 05/16/17 0001      Shoulder Exercises: Supine   Horizontal ABduction Strengthening;Both;10 reps;Theraband   Theraband Level (Shoulder Horizontal ABduction) Level 3 (Green)   External Rotation Strengthening;Both;12 reps;Theraband   Theraband Level (Shoulder External Rotation) Level 1 (Yellow)   Other Supine Exercises D2 flexion with green band x 12 reps   pt reported less discomfort than in standing     Shoulder Exercises: Sidelying   Other Sidelying Exercises sleeper stretch IR for Rt shoulder, x 15 sec x 2 reps     Shoulder Exercises: Standing   Flexion AROM;5 reps   Other Standing Exercises D2 flexion with yellow band x 10 reps with mirror as visual cue. (Pt reported increased discomfort after completing set)    Other Standing Exercises standing simulated golf swing with yellow and green band attached at door (bottom hinge) x 10 each .      Shoulder Exercises: Pulleys   Flexion 2 minutes  10 sec holds   ABduction 2 minutes  10 sec holds     Shoulder Exercises: ROM/Strengthening   UBE (Upper Arm Bike) L1; 1 min forward, 1 min backward    Wall Pushups 10 reps  1st set on wall, 2nd and 3rd set at counter (elbows out)     Shoulder Exercises: Stretch   Other Shoulder Stretches biceps stretch 30 sec x 2   Other Shoulder Stretches doorway stretch, three positions 30 sec x 2 reps each      Shoulder Exercises: Body Blade   Flexion 15  seconds;30 seconds  @ 55 deg   ABduction 15 seconds;30 seconds  @ ~70 deg     Modalities   Modalities Vasopneumatic     Vasopneumatic   Number Minutes Vasopneumatic  15 minutes   Vasopnuematic Location  Shoulder   Vasopneumatic Pressure Low   Vasopneumatic Temperature  34 deg                 PT Education - 05/16/17 1659    Education provided Yes   Education Details HEP   Person(s) Educated Patient   Methods Explanation;Handout;Verbal cues;Demonstration;Tactile cues   Comprehension Returned demonstration;Verbalized understanding          PT Short Term Goals - 04/28/17 1001      PT SHORT TERM GOAL #1   Title independent with initial HEP (04/05/17)   Time 4   Period Weeks   Status Achieved     PT SHORT TERM GOAL #2   Title improve PROM Rt shoulder to  full motion (within MD restrictions if still limited) for improved motion and function (04/05/17)   Time 4   Period Weeks   Status On-going  progressing            PT Long Term Goals - 05/12/17 1138      PT LONG TERM GOAL #1   Title independent with advanced HEP (05/31/17)   Time 12   Period Weeks   Status On-going     PT LONG TERM GOAL #2   Title improve AROM Rt shoulder to WNL for improved function and mobility (05/31/17)   Time 12   Period Weeks   Status On-going     PT LONG TERM GOAL #3   Title demonstrate at least 4/5 Rt shoulder strength for improved function and return to ADLs (05/31/17)   Time 12   Status Achieved     PT LONG TERM GOAL #4   Title perform golf simulated activities without pain or abnormal shoulder movements for return to recreational activities (05/31/17)   Time 12   Period Weeks   Status On-going               Plan - 05/16/17 1655    Clinical Impression Statement Pt tolerated all exercises well, reporting mild increase in discomfort and fatigue of Rt shoulder with standing resisted exercises.  Pt reported decrease in post exercise discomfort with use of vaso at end of session.  Progressing well towards remaining goals.    Rehab Potential Good   PT Frequency 2x / week   PT Duration 12 weeks   PT Treatment/Interventions ADLs/Self Care Home Management;Cryotherapy;Electrical Stimulation;Moist Heat;Ultrasound;Therapeutic exercise;Therapeutic activities;Patient/family education;Manual techniques;Scar mobilization;Passive range of motion;Vasopneumatic Device;Taping;Dry needling   PT Next Visit Plan Continue progressive ROM/strengthening for Rt shoulder.    Consulted and Agree with Plan of Care Patient      Patient will benefit from skilled therapeutic intervention in order to improve the following deficits and impairments:  Pain, Impaired UE functional use, Increased fascial restricitons, Decreased strength, Decreased range of motion, Postural  dysfunction  Visit Diagnosis: Acute pain of right shoulder  Stiffness of right shoulder, not elsewhere classified  Abnormal posture  Muscle weakness (generalized)     Problem List Patient Active Problem List   Diagnosis Date Noted  . Steatohepatitis 12/15/2016  . Kidney cyst, acquired 12/07/2016  . Partial tear of right subscapularis tendon 09/15/2016  . Glenohumeral arthritis, right 09/15/2016  . Elevated C-reactive protein (CRP) 08/13/2016  . Lateral epicondylitis of both elbows 01/02/2016  .  Patellofemoral arthralgia of both knees 10/31/2015  . Family history of prostate cancer 08/24/2013  . Insomnia 08/24/2013  . Uncontrolled type 2 diabetes with renal manifestation ([place]) 08/24/2013  . History of sleep apnea 08/24/2013   Kerin Perna, PTA 05/16/17 5:05 PM  [facility] [place], [place], [address] Phone: [phone]   Fax:  [phone]  Name: Cristian Watkins MRN: [id number] Date of Birth: 11/20/1961

## 2017-05-19 ENCOUNTER — Ambulatory Visit (INDEPENDENT_AMBULATORY_CARE_PROVIDER_SITE_OTHER): Payer: 59 | Admitting: Physical Therapy

## 2017-05-19 DIAGNOSIS — R293 Abnormal posture: Secondary | ICD-10-CM

## 2017-05-19 DIAGNOSIS — M25511 Pain in right shoulder: Secondary | ICD-10-CM

## 2017-05-19 DIAGNOSIS — M6281 Muscle weakness (generalized): Secondary | ICD-10-CM | POA: Diagnosis not present

## 2017-05-19 DIAGNOSIS — M25611 Stiffness of right shoulder, not elsewhere classified: Secondary | ICD-10-CM

## 2017-05-19 NOTE — Patient Instructions (Signed)
Kinesiology tape What is kinesiology tape?  There are many brands of kinesiology tape.  KTape, [employer], [employer], Dynamic tape, to name a few. It is an elasticized tape designed to support the body's natural healing process. This tape provides stability and support to muscles and joints without restricting motion. It can also help decrease swelling in the area of application. How does it work? The tape microscopically lifts and decompresses the skin to allow for drainage of lymph (swelling) to flow away from area, reducing inflammation.  The tape has the ability to help re-educate the neuromuscular system by targeting specific receptors in the skin.  The presence of the tape increases the body's awareness of posture and body mechanics.  Contraindications: . Open wounds . Skin lesions . Adhesive allergies Safe removal of the tape: In some rare cases, mild/moderate skin irritation can occur.  This can include redness, itchiness, or hives. If this occurs, immediately remove tape and consult your primary care physician if symptoms are severe or do not resolve within 2 days.  To remove tape safely, hold nearby skin with one hand and gentle roll tape down with other hand.  You can apply oil or conditioner to tape while in shower prior to removal to loosen adhesive.  DO NOT swiftly rip tape off like a band-aid, as this could cause skin tears and additional skin irritation.

## 2017-05-19 NOTE — Therapy (Signed)
[place], [place], [address] Phone: [phone]   Fax:  [phone]  Physical Therapy Treatment  Patient Details  Name: Cristian Watkins MRN: [id number] Date of Birth: [birth year] Referring Provider: Dr. Tamera Punt  Encounter Date: 05/19/2017      PT End of Session - 05/19/17 0900    Visit Number 21   Number of Visits 24   Date for PT Re-Evaluation 05/31/17   Authorization Type UHC - 30 visit limit   PT Start Time 0805   PT Stop Time 0906   PT Time Calculation (min) 61 min   Activity Tolerance Patient tolerated treatment well;No increased pain   Behavior During Therapy WFL for tasks assessed/performed      Past Medical History:  Diagnosis Date  . Diabetes [facility])     Past Surgical History:  Procedure Laterality Date  . rotator cuff surgeries  1999,2010    There were no vitals filed for this visit.      Subjective Assessment - 05/19/17 0808    Subjective Pt reports he played raquetball (with Lt hand) Monday afternoon; had low stamina.  He has been performing band exercises at home.     Currently in Pain? No/denies   Pain Score 0-No pain            [facility] PT Assessment - 05/19/17 0001      Assessment   Medical Diagnosis Rt RTC repair   Referring Provider Dr. Tamera Punt   Onset Date/Surgical Date 02/14/17   Hand Dominance Right   Next MD Visit 06/22/17   Prior Therapy none for this surgery     AROM   Right/Left Shoulder Right   Right Shoulder Flexion 150 Degrees  supine   Right Shoulder ABduction 146 Degrees  supine   Right Shoulder Internal Rotation 38 Degrees  shd 90 deg abduction    Right Shoulder External Rotation 90 Degrees  shoulder 90 deg abduction      Strength   Overall Strength --  Pt able to pull 60-70# force with BUE without symptoms.           [facility] Adult PT Treatment/Exercise - 05/19/17 0001      Shoulder Exercises: Supine   Horizontal ABduction  Strengthening;Both;10 reps;Theraband   Theraband Level (Shoulder Horizontal ABduction) Level 3 (Green)  2 sets   External Rotation Strengthening;Both;10 reps;Theraband  2 sets   Theraband Level (Shoulder External Rotation) Level 2 (Red)   Flexion Strengthening;Both;10 reps  overhead pull, cues for neutral hand position. 2 sets    Other Supine Exercises D2 flexion with green band x 10 reps, switched to red band x 10 - improved form.      Shoulder Exercises: Standing   Row Strengthening;Both;10 reps;Theraband   Theraband Level (Shoulder Row) Level 3 (Green)  tactile cues for scap retraction   Other Standing Exercises Simulated luggage lifting 10-20# from 24" to 3" multiple reps with VC and tactile cues for scap retraction and posture; no pain /symptoms.       Shoulder Exercises: Therapy Ball   Flexion 10 reps  rolling ball up wall, to tolerance- 5-10 sec hold   ABduction Limitations stepping under ball to stretch into abdct x 5 reps     Shoulder Exercises: ROM/Strengthening   UBE (Upper Arm Bike) L1:  2 min forward/2 min backward.      Modalities   Modalities Vasopneumatic     Vasopneumatic   Number Minutes Vasopneumatic  15 minutes  Vasopnuematic Location  Shoulder  Rt   Vasopneumatic Pressure Low   Vasopneumatic Temperature  34 deg      Manual Therapy   Manual Therapy Taping   Kinesiotex Facilitate Muscle     Kinesiotix   Facilitate Muscle  I strip of rock tape applied to Rt posterior shoulder with15% stretch on diagonal (similar to lower trap) to increase postural awareness, proprioception, faciliate muscle.                  PT Education - 05/19/17 0902    Education provided Yes   Education Details info on [place] tape.    Person(s) Educated Patient   Methods Handout;Explanation   Comprehension Verbalized understanding          PT Short Term Goals - 04/28/17 1001      PT SHORT TERM GOAL #1   Title independent with initial HEP (04/05/17)   Time 4    Period Weeks   Status Achieved     PT SHORT TERM GOAL #2   Title improve PROM Rt shoulder to full motion (within MD restrictions if still limited) for improved motion and function (04/05/17)   Time 4   Period Weeks   Status On-going  progressing            PT Long Term Goals - 05/12/17 1138      PT LONG TERM GOAL #1   Title independent with advanced HEP (05/31/17)   Time 12   Period Weeks   Status On-going     PT LONG TERM GOAL #2   Title improve AROM Rt shoulder to WNL for improved function and mobility (05/31/17)   Time 12   Period Weeks   Status On-going     PT LONG TERM GOAL #3   Title demonstrate at least 4/5 Rt shoulder strength for improved function and return to ADLs (05/31/17)   Time 12   Status Achieved     PT LONG TERM GOAL #4   Title perform golf simulated activities without pain or abnormal shoulder movements for return to recreational activities (05/31/17)   Time 12   Period Weeks   Status On-going               Plan - 05/19/17 1144    Clinical Impression Statement Pt tolerated all exercises well, including simulated luggage lifting, without any increase in symptoms.  Pt's ROM in Rt shoulder remains similar to last assessment.  He is progressing well towards established goals.    Rehab Potential Good   PT Frequency 2x / week   PT Duration 12 weeks   PT Treatment/Interventions ADLs/Self Care Home Management;Cryotherapy;Electrical Stimulation;Moist Heat;Ultrasound;Therapeutic exercise;Therapeutic activities;Patient/family education;Manual techniques;Scar mobilization;Passive range of motion;Vasopneumatic Device;Taping;Dry needling   PT Next Visit Plan Continue progressive ROM/strengthening for Rt shoulder. Add in strengthening for RUE simulating work tasks.  Returns full duty 7/11.    Consulted and Agree with Plan of Care Patient      Patient will benefit from skilled therapeutic intervention in order to improve the following deficits and  impairments:  Pain, Impaired UE functional use, Increased fascial restricitons, Decreased strength, Decreased range of motion, Postural dysfunction  Visit Diagnosis: Acute pain of right shoulder  Stiffness of right shoulder, not elsewhere classified  Abnormal posture  Muscle weakness (generalized)     Problem List Patient Active Problem List   Diagnosis Date Noted  . Steatohepatitis 12/15/2016  . Kidney cyst, acquired 12/07/2016  . Partial tear of right subscapularis tendon 09/15/2016  .  Glenohumeral arthritis, right 09/15/2016  . Elevated C-reactive protein (CRP) 08/13/2016  . Lateral epicondylitis of both elbows 01/02/2016  . Patellofemoral arthralgia of both knees 10/31/2015  . Family history of prostate cancer 08/24/2013  . Insomnia 08/24/2013  . 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 History of sleep apnea 08/24/2013   Kerin Perna, PTA 05/19/17 11:48 AM  [facility] [place], [place], [address] Phone: [phone]   Fax:  [phone]  Name: MAXSON ODDO MRN: [id number] Date of Birth: July 06, 1961

## 2017-05-21 ENCOUNTER — Other Ambulatory Visit: Payer: Self-pay

## 2017-05-21 MED ORDER — CANAGLIFLOZIN 100 MG PO TABS: 100.0000 mg | ORAL_TABLET | Freq: Every day | ORAL | 0 refills | Status: DC

## 2017-05-23 ENCOUNTER — Ambulatory Visit (INDEPENDENT_AMBULATORY_CARE_PROVIDER_SITE_OTHER): Payer: 59 | Admitting: Physical Therapy

## 2017-05-23 DIAGNOSIS — M25611 Stiffness of right shoulder, not elsewhere classified: Secondary | ICD-10-CM | POA: Diagnosis not present

## 2017-05-23 DIAGNOSIS — R293 Abnormal posture: Secondary | ICD-10-CM | POA: Diagnosis not present

## 2017-05-23 DIAGNOSIS — M6281 Muscle weakness (generalized): Secondary | ICD-10-CM

## 2017-05-23 DIAGNOSIS — M25511 Pain in right shoulder: Secondary | ICD-10-CM

## 2017-05-23 NOTE — Therapy (Signed)
[place], [place], [address] Phone: [phone]   Fax:  [phone]  Physical Therapy Treatment  Patient Details  Name: Cristian Watkins MRN: [id number] Date of Birth: 1961/02/13 Referring Provider: Dr. Tamera Punt  Encounter Date: 05/23/2017      PT End of Session - 05/23/17 0852    Visit Number 22   Number of Visits 24   Date for PT Re-Evaluation 05/31/17   Authorization Type UHC - 30 visit limit   PT Start Time 0847   PT Stop Time 0928   PT Time Calculation (min) 41 min   Activity Tolerance Patient tolerated treatment well;No increased pain   Behavior During Therapy WFL for tasks assessed/performed      Past Medical History:  Diagnosis Date  . Diabetes [facility])     Past Surgical History:  Procedure Laterality Date  . rotator cuff surgeries  1999,2010    There were no vitals filed for this visit.      Subjective Assessment - 05/23/17 0850    Subjective Pt reports he did some yard work and exercises over weekend, with no issues or symptoms.    Currently in Pain? No/denies   Pain Score 0-No pain            [facility] PT Assessment - 05/23/17 0001      Assessment   Medical Diagnosis Rt RTC repair   Referring Provider Dr. Tamera Punt   Onset Date/Surgical Date 02/14/17   Hand Dominance Right   Next MD Visit 06/22/17          [facility] Adult PT Treatment/Exercise - 05/23/17 0001      Elbow Exercises   Elbow Flexion Strengthening;Right;10 reps;Standing  1 rep, 10 sec hold. 8 reps-5 sec hold with elbow at 90 deg.    Theraband Level (Elbow Flexion) Level 4 (Blue)   Bar Weights/Barbell (Elbow Flexion) --  10lb, 5 reps     Shoulder Exercises: Standing   Row Strengthening;Both   Theraband Level (Shoulder Row) Level 4 (Blue)  with 3 sec pause in retraction, tactile cues    Other Standing Exercises horiz add with blue band x 10 reps (holding cone to simulate opening airplane door)    Other  Standing Exercises simulate golf swing with cane x 25 rep, to tolerance;  Rt shoulder flexion neutral to 50 deg with 5# (setting weight on cart/ waist level shelf x 25 reps     Shoulder Exercises: ROM/Strengthening   UBE (Upper Arm Bike) L3:  2 min forward/2 min backward.    Wall Pushups 10 reps  on counter top, elbows in    Pushups Limitations then 1/2 pushup, pause for 2-3 sec.      Shoulder Exercises: Stretch   Other Shoulder Stretches biceps stretch 30 sec x 2   Other Shoulder Stretches doorway stretch, three positions 30 sec x 2 reps each      Pt declined modalities; will use ice at home as needed.        PT Short Term Goals - 04/28/17 1001      PT SHORT TERM GOAL #1   Title independent with initial HEP (04/05/17)   Time 4   Period Weeks   Status Achieved     PT SHORT TERM GOAL #2   Title improve PROM Rt shoulder to full motion (within MD restrictions if still limited) for improved motion and function (04/05/17)   Time 4   Period Weeks   Status On-going  progressing            PT Long Term Goals - 05/12/17 1138      PT LONG TERM GOAL #1   Title independent with advanced HEP (05/31/17)   Time 12   Period Weeks   Status On-going     PT LONG TERM GOAL #2   Title improve AROM Rt shoulder to WNL for improved function and mobility (05/31/17)   Time 12   Period Weeks   Status On-going     PT LONG TERM GOAL #3   Title demonstrate at least 4/5 Rt shoulder strength for improved function and return to ADLs (05/31/17)   Time 12   Status Achieved     PT LONG TERM GOAL #4   Title perform golf simulated activities without pain or abnormal shoulder movements for return to recreational activities (05/31/17)   Time 12   Period Weeks   Status On-going               Plan - 05/23/17 0240    Clinical Impression Statement Pt tolerated new exercises for Rt shoulder with increased difficulty, without increase in symptoms. Exercises simulating work activities have been  added into program.  Progressing well towards remaining goals.    Rehab Potential Good   PT Frequency 2x / week   PT Duration 12 weeks   PT Treatment/Interventions ADLs/Self Care Home Management;Cryotherapy;Electrical Stimulation;Moist Heat;Ultrasound;Therapeutic exercise;Therapeutic activities;Patient/family education;Manual techniques;Scar mobilization;Passive range of motion;Vasopneumatic Device;Taping;Dry needling   PT Next Visit Plan Continue progressive ROM/strengthening for Rt shoulder. Add in strengthening for RUE simulating work tasks.  Returns full duty 7/11.    Consulted and Agree with Plan of Care Patient      Patient will benefit from skilled therapeutic intervention in order to improve the following deficits and impairments:  Pain, Impaired UE functional use, Increased fascial restricitons, Decreased strength, Decreased range of motion, Postural dysfunction  Visit Diagnosis: Acute pain of right shoulder  Stiffness of right shoulder, not elsewhere classified  Abnormal posture  Muscle weakness (generalized)     Problem List Patient Active Problem List   Diagnosis Date Noted  . Steatohepatitis 12/15/2016  . Kidney cyst, acquired 12/07/2016  . Partial tear of right subscapularis tendon 09/15/2016  . Glenohumeral arthritis, right 09/15/2016  . Elevated C-reactive protein (CRP) 08/13/2016  . Lateral epicondylitis of both elbows 01/02/2016  . Patellofemoral arthralgia of both knees 10/31/2015  . Family history of prostate cancer 08/24/2013  . Insomnia 08/24/2013  . 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 History of sleep apnea 08/24/2013   Kerin Perna, PTA 05/23/17 9:51 AM  [facility] [place], [place], [address] Phone: [phone]   Fax:  [phone]  Name: Cristian Watkins MRN: [id number] Date of Birth: [birth year]

## 2017-05-26 ENCOUNTER — Telehealth: Payer: Self-pay | Admitting: Family Medicine

## 2017-05-26 ENCOUNTER — Ambulatory Visit (INDEPENDENT_AMBULATORY_CARE_PROVIDER_SITE_OTHER): Payer: 59 | Admitting: Physical Therapy

## 2017-05-26 DIAGNOSIS — M25611 Stiffness of right shoulder, not elsewhere classified: Secondary | ICD-10-CM

## 2017-05-26 DIAGNOSIS — M6281 Muscle weakness (generalized): Secondary | ICD-10-CM | POA: Diagnosis not present

## 2017-05-26 DIAGNOSIS — R293 Abnormal posture: Secondary | ICD-10-CM | POA: Diagnosis not present

## 2017-05-26 DIAGNOSIS — M25511 Pain in right shoulder: Secondary | ICD-10-CM

## 2017-05-26 NOTE — Telephone Encounter (Signed)
Called pt and left a message that he is due for a F/u appt with Dr.Corey on Diabetes

## 2017-05-26 NOTE — Therapy (Addendum)
[place] [address] [place], [place], [address] Phone: [phone]   Fax:  [phone]  Physical Therapy Treatment  Patient Details  Name: Cristian Watkins MRN: [id number] Date of Birth: 1961/06/05 Referring Provider: Dr. Tamera Punt  Encounter Date: 05/26/2017      PT End of Session - 05/26/17 1102    Visit Number 23   Number of Visits 24   Date for PT Re-Evaluation 05/31/17   Authorization Type UHC - 30 visit limit   PT Start Time 1103   PT Stop Time 1131   PT Time Calculation (min) 28 min   Activity Tolerance Patient tolerated treatment well;Patient limited by fatigue   Behavior During Therapy [facility] for tasks assessed/performed      Past Medical History:  Diagnosis Date  . Diabetes [facility])     Past Surgical History:  Procedure Laterality Date  . rotator cuff surgeries  1999,2010    There were no vitals filed for this visit.      Subjective Assessment - 05/26/17 1105    Subjective Pt has had a cold for the last couple days and has not been doing home exercises. Patient is not feeling up to doing much today; requests a shortened session.    Currently in Pain? No/denies          [facility] Adult PT Treatment/Exercise - 05/26/17 0001      Shoulder Exercises: Prone   Flexion Right;10 reps   Horizontal ABduction 1 Right;10 reps   Other Prone Exercises Prone row 5 reps, 1#; 5 reps, 2# RUE     Shoulder Exercises: Standing   External Rotation Strengthening;Both;10 reps;Theraband   Theraband Level (Shoulder External Rotation) Level 3 (Green)  2 sets   Campbell Soup;Both   Theraband Level (Shoulder Row) Level 4 (Blue)  with 3 sec pause in retraction, tactile cues      Shoulder Exercises: ROM/Strengthening   UBE (Upper Arm Bike) L3:  2 min forward/2 min backward.   Standing     Shoulder Exercises: Stretch   Other Shoulder Stretches doorway stretch, middle position with both arms 30 sec x 2 reps; upper,  then lower right position 30 sec x2                   PT Short Term Goals - 04/28/17 1001      PT SHORT TERM GOAL #1   Title independent with initial HEP (04/05/17)   Time 4   Period Weeks   Status Achieved     PT SHORT TERM GOAL #2   Title improve PROM Rt shoulder to full motion (within MD restrictions if still limited) for improved motion and function (04/05/17)   Time 4   Period Weeks   Status On-going  progressing            PT Long Term Goals - 05/12/17 1138      PT LONG TERM GOAL #1   Title independent with advanced HEP (05/31/17)   Time 12   Period Weeks   Status On-going     PT LONG TERM GOAL #2   Title improve AROM Rt shoulder to WNL for improved function and mobility (05/31/17)   Time 12   Period Weeks   Status On-going     PT LONG TERM GOAL #3   Title demonstrate at least 4/5 Rt shoulder strength for improved function and return to ADLs (05/31/17)   Time 12   Status Achieved  PT LONG TERM GOAL #4   Title perform golf simulated activities without pain or abnormal shoulder movements for return to recreational activities (05/31/17)   Time 12   Period Weeks   Status On-going               Plan - 05/26/17 1129    Clinical Impression Statement Pt tolerated treatment with limited increase in pain; pain resolved with rest. Patient was easily fatigued from feeling ill (cold symptoms). Patient's treatment time limited per patient request due to illness.   Rehab Potential Good   PT Frequency 2x / week   PT Duration 12 weeks   PT Treatment/Interventions ADLs/Self Care Home Management;Cryotherapy;Electrical Stimulation;Moist Heat;Ultrasound;Therapeutic exercise;Therapeutic activities;Patient/family education;Manual techniques;Scar mobilization;Passive range of motion;Vasopneumatic Device;Taping;Dry needling   PT Next Visit Plan Test right shoulder strength, review FOTO. Pt requests to be placed on hold after next visit.   Consulted and Agree with  Plan of Care Patient      Patient will benefit from skilled therapeutic intervention in order to improve the following deficits and impairments:  Pain, Impaired UE functional use, Increased fascial restricitons, Decreased strength, Decreased range of motion, Postural dysfunction  Visit Diagnosis: Acute pain of right shoulder  Stiffness of right shoulder, not elsewhere classified  Abnormal posture  Muscle weakness (generalized)     Problem List Patient Active Problem List   Diagnosis Date Noted  . Steatohepatitis 12/15/2016  . Kidney cyst, acquired 12/07/2016  . Partial tear of right subscapularis tendon 09/15/2016  . Glenohumeral arthritis, right 09/15/2016  . Elevated C-reactive protein (CRP) 08/13/2016  . Lateral epicondylitis of both elbows 01/02/2016  . Patellofemoral arthralgia of both knees 10/31/2015  . Family history of prostate cancer 08/24/2013  . Insomnia 08/24/2013  . 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 History of sleep apnea 08/24/2013    Andria Meuse, SPTA 05/26/2017, 12:05 PM   During this treatment session, the therapist was present, participating in and directing the treatment. Kerin Perna, PTA 05/26/17 12:07 PM   [place], [place], [address] Phone: [phone]   Fax:  [phone]  Name: Cristian Watkins MRN: [id number] Date of Birth: 1961-05-24

## 2017-05-31 ENCOUNTER — Other Ambulatory Visit: Payer: Self-pay | Admitting: Family Medicine

## 2017-05-31 ENCOUNTER — Ambulatory Visit (INDEPENDENT_AMBULATORY_CARE_PROVIDER_SITE_OTHER): Payer: 59 | Admitting: Physical Therapy

## 2017-05-31 DIAGNOSIS — R293 Abnormal posture: Secondary | ICD-10-CM

## 2017-05-31 DIAGNOSIS — M25611 Stiffness of right shoulder, not elsewhere classified: Secondary | ICD-10-CM

## 2017-05-31 DIAGNOSIS — M25511 Pain in right shoulder: Secondary | ICD-10-CM | POA: Diagnosis not present

## 2017-05-31 NOTE — Therapy (Addendum)
[place] [place], [place], [address] Phone: [phone]   Fax:  [phone]  Physical Therapy Treatment  Patient Details  Name: Cristian Watkins MRN: [id number] Date of Birth: 1961-11-15 Referring Provider: Dr. Tamera Punt  Encounter Date: 05/31/2017      PT End of Session - 05/31/17 0817    Visit Number 24   Number of Visits 24   Date for PT Re-Evaluation 05/31/17   Authorization Type [employer] - 30 visit limit   PT Start Time 0810   PT Stop Time 0843   PT Time Calculation (min) 33 min   Activity Tolerance Patient tolerated treatment well   Behavior During Therapy [facility] for tasks assessed/performed      Past Medical History:  Diagnosis Date  . Diabetes [facility])     Past Surgical History:  Procedure Laterality Date  . rotator cuff surgeries  1999,2010    There were no vitals filed for this visit.      Subjective Assessment - 05/31/17 0817    Subjective Pt reports he tried swinging driver clubs in his back yard; had some pain (3/10) in posterior Rt arm (tricep area/post delt) that dissappated with rest.  Today he returns to work; he is nervous but excited.  He verbalized desire to be placed on hold for 2 wks following today's session; will work on HEP at home.    Currently in Pain? No/denies   Pain Score 0-No pain            [facility] PT Assessment - 05/31/17 0001      Assessment   Medical Diagnosis Rt RTC repair   Referring Provider Dr. Tamera Punt   Onset Date/Surgical Date 02/14/17   Hand Dominance Right   Next MD Visit 06/22/17     Observation/Other Assessments   Focus on Therapeutic Outcomes (FOTO)  32% limited.      AROM   Right Shoulder Extension 52 Degrees  standing   Right Shoulder Flexion 150 Degrees  standing    Right Shoulder ABduction 142 Degrees  standing.    Right Shoulder Internal Rotation 50 Degrees  supine, abd 60 deg    Right Shoulder External Rotation 85 Degrees  supine, abd  90 deg      Strength   Right Shoulder Flexion 4+/5   Right Shoulder Extension 5/5   Right Shoulder ABduction --  5-/5   Right Shoulder Internal Rotation --  5-/5   Right Shoulder External Rotation 4/5                     [facility] Adult PT Treatment/Exercise - 05/31/17 0001      Shoulder Exercises: Standing   External Rotation Strengthening;Both;10 reps   Theraband Level (Shoulder External Rotation) Level 3 (Green)  arms to W position   [employer];Both  2 sets   Theraband Level (Shoulder Row) Level 4 (Blue)  with 5 sec pause in retraction, tactile cues    Other Standing Exercises D2 flex with red band in front of mirror x 10 reps    Other Standing Exercises box lift from/to 20" to/from waist, 1 rep each 17#, 27#, 37#, 47# - no pain. min cues for posture.  20# lift of bag (simulate lifting luggage at work) x 5 reps.      Shoulder Exercises: ROM/Strengthening   UBE (Upper Arm Bike) L3: 1 min forward/1 min backward.,      Shoulder Exercises: Stretch   Cross Chest Stretch 2 reps;20  seconds  RUE   Other Shoulder Stretches biceps stretch 30 sec x 2   Other Shoulder Stretches doorway stretch, middle position with both arms 30 sec x 2 reps; upper, then lower right position 30 sec x2                   PT Short Term Goals - 05/31/17 0900      PT SHORT TERM GOAL #1   Title independent with initial HEP (04/05/17)   Time 4   Period Weeks   Status Achieved     PT SHORT TERM GOAL #2   Title improve PROM Rt shoulder to full motion (within MD restrictions if still limited) for improved motion and function (04/05/17)   Time 4   Period Weeks   Status Achieved           PT Long Term Goals - 05/31/17 7628      PT LONG TERM GOAL #1   Title independent with advanced HEP (05/31/17)   Time 12   Period Weeks   Status On-going     PT LONG TERM GOAL #2   Title improve AROM Rt shoulder to WNL for improved function and mobility (05/31/17)   Time 12   Period  Weeks   Status Achieved     PT LONG TERM GOAL #3   Title demonstrate at least 4/5 Rt shoulder strength for improved function and return to ADLs (05/31/17)   Time 12   Period Weeks   Status Achieved     PT LONG TERM GOAL #4   Title perform golf simulated activities without pain or abnormal shoulder movements for return to recreational activities (05/31/17)   Time 12   Period Weeks   Status On-going  improving, but still painful.                Plan - 05/31/17 0900    Clinical Impression Statement Pt tolerated all exercises and simulated work exercises without increase in pain. [name]'s FOTO score has improved to 32% limited.   Pt has met all STG and is making good progress towards remaining goals.      Rehab Potential Good   PT Frequency 2x / week   PT Duration 12 weeks   PT Treatment/Interventions ADLs/Self Care Home Management;Cryotherapy;Electrical Stimulation;Moist Heat;Ultrasound;Therapeutic exercise;Therapeutic activities;Patient/family education;Manual techniques;Scar mobilization;Passive range of motion;Vasopneumatic Device;Taping;Dry needling   PT Next Visit Plan Will hold therapy 2 wks per pt request. If pt doesn't return by 7/24, will d/c.    Consulted and Agree with Plan of Care Patient      Patient will benefit from skilled therapeutic intervention in order to improve the following deficits and impairments:  Pain, Impaired UE functional use, Increased fascial restricitons, Decreased strength, Decreased range of motion, Postural dysfunction  Visit Diagnosis: Acute pain of right shoulder  Stiffness of right shoulder, not elsewhere classified  Abnormal posture     Problem List Patient Active Problem List   Diagnosis Date Noted  . Steatohepatitis 12/15/2016  . Kidney cyst, acquired 12/07/2016  . Partial tear of right subscapularis tendon 09/15/2016  . Glenohumeral arthritis, right 09/15/2016  . Elevated C-reactive protein (CRP) 08/13/2016  . Lateral  epicondylitis of both elbows 01/02/2016  . Patellofemoral arthralgia of both knees 10/31/2015  . Family history of prostate cancer 08/24/2013  . Insomnia 08/24/2013  . 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 History of sleep apnea 08/24/2013   Kerin Perna, PTA 05/31/17 9:05 AM  [facility] [place] [address] [place] [place], [place], [address] Phone: [phone]   Fax:  [phone]  Name: Cristian Watkins MRN: [id number] Date of Birth: 04-21-1961  PHYSICAL THERAPY DISCHARGE SUMMARY  Visits from Start of Care: 24  Current functional level related to goals / functional outcomes: Good progress with rehab. See last progress note for discharge status.   Remaining deficits: Persistent weakness and limited ROM    Education / Equipment: HEP Plan: Patient agrees to discharge.  Patient goals were partially met. Patient is being discharged due to                                                     ?????    Insurance coverage meet. No further treatment covered at this time.   Celyn P. Helene Kelp PT, MPH 06/30/17 12:32 PM

## 2017-06-01 ENCOUNTER — Telehealth: Payer: Self-pay | Admitting: *Deleted

## 2017-06-01 ENCOUNTER — Telehealth: Payer: Self-pay | Admitting: Family Medicine

## 2017-06-01 ENCOUNTER — Ambulatory Visit: Payer: 59 | Admitting: Family Medicine

## 2017-06-01 DIAGNOSIS — E1121 Type 2 diabetes mellitus with diabetic nephropathy: Secondary | ICD-10-CM

## 2017-06-01 DIAGNOSIS — E1165 Type 2 diabetes mellitus with hyperglycemia: Secondary | ICD-10-CM

## 2017-06-01 DIAGNOSIS — IMO0002 Reserved for concepts with insufficient information to code with codable children: Secondary | ICD-10-CM

## 2017-06-01 DIAGNOSIS — N281 Cyst of kidney, acquired: Secondary | ICD-10-CM

## 2017-06-01 DIAGNOSIS — K7581 Nonalcoholic steatohepatitis (NASH): Secondary | ICD-10-CM

## 2017-06-01 NOTE — Telephone Encounter (Signed)
-----   Message from Gregor Hams, MD sent at 12/07/2016 12:41 PM EST ----- Regarding: Consider CT scan in 6 months Follow-up renal cysts seen on [facility]

## 2017-06-01 NOTE — Telephone Encounter (Signed)
Clinical information needed to process request for MRI of Abdomen w and w/o contrast. Will fax [phone].Cristian Watkins [place]

## 2017-06-01 NOTE — Telephone Encounter (Signed)
Called pt about Kidney Cyst.  MRI ordered as well as labs.  Will follow up on the 18th for DM visit.  Labs fasting on Monday

## 2017-06-02 ENCOUNTER — Encounter: Payer: 59 | Admitting: Physical Therapy

## 2017-06-06 LAB — COMPLETE METABOLIC PANEL WITH GFR
ALBUMIN: 4.3 g/dL (ref 3.6–5.1)
ALK PHOS: 50 U/L (ref 40–115)
ALT: 50 U/L — AB (ref 9–46)
AST: 28 U/L (ref 10–35)
BILIRUBIN TOTAL: 0.5 mg/dL (ref 0.2–1.2)
BUN: 11 mg/dL (ref 7–25)
CO2: 21 mmol/L (ref 20–31)
CREATININE: 0.75 mg/dL (ref 0.70–1.33)
Calcium: 8.9 mg/dL (ref 8.6–10.3)
Chloride: 106 mmol/L (ref 98–110)
GFR, Est African American: >89 mL/min (ref >=60)
GFR, Est Non African American: >89 mL/min (ref >=60)
GLUCOSE: 142 mg/dL — AB (ref 65–99)
Potassium: 4.2 mmol/L (ref 3.5–5.3)
SODIUM: 138 mmol/L (ref 135–146)
TOTAL PROTEIN: 6.6 g/dL (ref 6.1–8.1)

## 2017-06-06 LAB — CBC
HCT: 46.7 % (ref 38.5–50.0)
HEMOGLOBIN: 15.6 g/dL (ref 13.2–17.1)
MCH: 27.4 pg (ref 27.0–33.0)
MCHC: 33.4 g/dL (ref 32.0–36.0)
MCV: 81.9 fL (ref 80.0–100.0)
MPV: 10.4 fL (ref 7.5–12.5)
Platelets: 229 10*3/uL (ref 140–400)
RBC: 5.7 MIL/uL (ref 4.20–5.80)
RDW: 15.3 % — ABNORMAL HIGH (ref 11.0–15.0)
WBC: 5.6 10*3/uL (ref 3.8–10.8)

## 2017-06-06 LAB — LIPID PANEL W/REFLEX DIRECT LDL
Cholesterol: 128 mg/dL (ref <200)
HDL: 51 mg/dL (ref >40)
LDL-Cholesterol: 63 mg/dL
Non-HDL Cholesterol (Calc): 77 mg/dL (ref <130)
Total CHOL/HDL Ratio: 2.5 Ratio (ref <5.0)
Triglycerides: 60 mg/dL (ref <150)

## 2017-06-07 LAB — HEMOGLOBIN A1C
Hgb A1c MFr Bld: 7.3 % — ABNORMAL HIGH (ref <5.7)
MEAN PLASMA GLUCOSE: 163 mg/dL

## 2017-06-08 ENCOUNTER — Ambulatory Visit (INDEPENDENT_AMBULATORY_CARE_PROVIDER_SITE_OTHER): Payer: 59 | Admitting: Family Medicine

## 2017-06-08 ENCOUNTER — Encounter: Payer: Self-pay | Admitting: Family Medicine

## 2017-06-08 VITALS — BP 119/72 | HR 91 | Ht 70.0 in | Wt 210.0 lb

## 2017-06-08 DIAGNOSIS — N281 Cyst of kidney, acquired: Secondary | ICD-10-CM

## 2017-06-08 DIAGNOSIS — E1121 Type 2 diabetes mellitus with diabetic nephropathy: Secondary | ICD-10-CM

## 2017-06-08 DIAGNOSIS — K7581 Nonalcoholic steatohepatitis (NASH): Secondary | ICD-10-CM | POA: Diagnosis not present

## 2017-06-08 NOTE — Patient Instructions (Signed)
Thank you for coming in today. Continue medicines.  Work on a low carb diet.  Try to get less than 45g of carbs per meal.  Try to exercise a few day per week.  You should hear about the CT scan soon.  Recheck in 3 months to follow up diabetes and weight.

## 2017-06-08 NOTE — Progress Notes (Signed)
Cristian Watkins is a 56 y.o. male who presents to [place]: [place] today for follow-up diabetes, kidney cyst and steatohepatitis.  Diabetes: Patient takes medications listed below and his blood sugars are typically well controlled. He denies polyuria or polydipsia. He does not follow a carb reduced diet.  Kidney cyst: Patient had a CT scan done about 7 months ago for an incidental renal cyst was discovered. He is due for follow-up. He is asymptomatic with no urinary frequency urgency or dysuria.  Steatohepatitis: Patient has a history of steatohepatitis and feels well with no abdominal pain.   Past Medical History:  Diagnosis Date  . Diabetes [facility])    Past Surgical History:  Procedure Laterality Date  . rotator cuff surgeries  5956,3875   Social History  Substance Use Topics  . Smoking status: Never Smoker  . Smokeless tobacco: Never Used  . Alcohol use Yes   family history includes Prostate cancer in his father.  ROS as above:  Medications: Current Outpatient Prescriptions  Medication Sig Dispense Refill  . AMBULATORY NON FORMULARY MEDICATION lancets, test strips of choice for testing 2x daily E11.29 Uncontrolled diabetes 1 each 0  . canagliflozin (INVOKANA) 100 MG TABS tablet Take 1 tablet (100 mg total) by mouth daily before breakfast. Must schedule appointment 90 tablet 0  . KOMBIGLYZE XR 2.03-999 MG TB24 TAKE 1 TABLET BY MOUTH 2 (TWO) TIMES DAILY. 180 tablet 0  . lisinopril (PRINIVIL,ZESTRIL) 2.5 MG tablet TAKE 1 TABLET (2.5 MG TOTAL) BY MOUTH DAILY. DUE FOR FOLLOW UP VISIT 30 tablet 0   No current facility-administered medications for this visit.    No Known Allergies  Health Maintenance Health Maintenance  Topic Date Due  . INFLUENZA VACCINE  06/22/2017  . FOOT EXAM  10/08/2017  . HEMOGLOBIN A1C  12/07/2017  . OPHTHALMOLOGY EXAM  04/11/2018  .  COLONOSCOPY  07/24/2020  . PNEUMOCOCCAL POLYSACCHARIDE VACCINE (2) 10/08/2021  . TETANUS/TDAP  07/09/2024  . Hepatitis C Screening  Completed  . HIV Screening  Completed     Exam:  BP 119/72   Pulse 91   Ht 5' 10"  (1.778 m)   Wt 210 lb (95.3 kg)   BMI 30.13 kg/m  Gen: Well NAD HEENT: EOMI,  MMM Lungs: Normal work of breathing. CTABL Heart: RRR no MRG Abd: NABS, Soft. Nondistended, Nontender Exts: Brisk capillary refill, warm and well perfused.    Results for orders placed or performed in visit on 06/01/17 (from the past 72 hour(s))  CBC     Status: Abnormal   Collection Time: 06/06/17  7:40 AM  Result Value Ref Range   WBC 5.6 3.8 - 10.8 K/uL   RBC 5.70 4.20 - 5.80 MIL/uL   Hemoglobin 15.6 13.2 - 17.1 g/dL   HCT 46.7 38.5 - 50.0 %   MCV 81.9 80.0 - 100.0 fL   MCH 27.4 27.0 - 33.0 pg   MCHC 33.4 32.0 - 36.0 g/dL   RDW 15.3 (H) 11.0 - 15.0 %   Platelets 229 140 - 400 K/uL   MPV 10.4 7.5 - 12.5 fL  COMPLETE METABOLIC PANEL WITH GFR     Status: Abnormal   Collection Time: 06/06/17  7:40 AM  Result Value Ref Range   Sodium 138 135 - 146 mmol/L   Potassium 4.2 3.5 - 5.3 mmol/L   Chloride 106 98 - 110 mmol/L   CO2 21 20 - 31 mmol/L   Glucose, Bld 142 (  H) 65 - 99 mg/dL   BUN 11 7 - 25 mg/dL   Creat 0.75 0.70 - 1.33 mg/dL    Comment:   For patients > or = 57 years of age: The upper reference limit for Creatinine is approximately 13% higher for people identified as African-American.      Total Bilirubin 0.5 0.2 - 1.2 mg/dL   Alkaline Phosphatase 50 40 - 115 U/L   AST 28 10 - 35 U/L   ALT 50 (H) 9 - 46 U/L   Total Protein 6.6 6.1 - 8.1 g/dL   Albumin 4.3 3.6 - 5.1 g/dL   Calcium 8.9 8.6 - 10.3 mg/dL   GFR, Est African American >89 >=60 mL/min   GFR, Est Non African American >89 >=60 mL/min  Lipid Panel w/reflex Direct LDL     Status: None   Collection Time: 06/06/17  7:40 AM  Result Value Ref Range   Cholesterol 128 <200 mg/dL   Triglycerides 60 <150 mg/dL    HDL 51 >40 mg/dL   Total CHOL/HDL Ratio 2.5 <5.0 Ratio   Non-HDL Cholesterol (Calc) 77 <130 mg/dL    Comment:   For patients with diabetes plus 1 major ASCVD risk factor, treating to a non-HDL-C goal of <100 mg/dL (LDL-C of <70 mg/dL) is considered a therapeutic option.      LDL-Cholesterol 63 mg/dL    Comment: Reference range: <100   Desirable range <100 mg/dL for primary prevention; <70 mg/dL for patients with CHD or diabetic patients with > or = 2 CHD risk factors.     The Martin-Hopkins calculation is a validated novel method that provides better accuracy than the Friedwald equation in the estimation of LDL-C, particularly when TG levels are 150-400 mg/dL and LDL-C levels are lower than 70 mg/dL. Reference:  Cresenciano Genre et al.  Comparison of a Novel Method vs the Aurelio Jew for Estimating Low-Density Lipoprotein Cholesterol Levels From the Standard Lipid Profile.  JAMA. 1607;371(06): 2061-2068.   For additional information, please refer to http://education.[facility].com/faq/FAQ164 (This link is being provided for informational/educational purposes only.)   Hemoglobin A1c     Status: Abnormal   Collection Time: 06/06/17  7:40 AM  Result Value Ref Range   Hgb A1c MFr Bld 7.3 (H) <5.7 %    Comment:   For someone without known diabetes, a hemoglobin A1c value of 6.5% or greater indicates that they may have diabetes and this should be confirmed with a follow-up test.   For someone with known diabetes, a value <7% indicates that their diabetes is well controlled and a value greater than or equal to 7% indicates suboptimal control. A1c targets should be individualized based on duration of diabetes, age, comorbid conditions, and other considerations.   Currently, no consensus exists for use of hemoglobin A1c for diagnosis of diabetes for children.      Mean Plasma Glucose 163 mg/dL   No results found.    Assessment and Plan: 56 y.o. male with    Diabetes: A1c is a bit elevated but at goal. Plan to work on a reduced carbohydrate diet and recheck in 3 months.  Renal cyst: Ideally would be best imaged with an MRI however this is been declined by the health insurance company. I'll try for a CT scan with contrast.  Steatohepatitis: Mild transaminitis today. Stable to improved. Plan for watchful waiting. Work on reduced [employer].   No orders of the defined types were placed in this encounter.  No orders of the defined  types were placed in this encounter.    Discussed warning signs or symptoms. Please see discharge instructions. Patient expresses understanding.

## 2017-06-09 ENCOUNTER — Telehealth: Payer: Self-pay

## 2017-06-09 NOTE — Telephone Encounter (Signed)
Renal CT approved -[facility]  Approval #: [id number]

## 2017-06-13 ENCOUNTER — Ambulatory Visit (INDEPENDENT_AMBULATORY_CARE_PROVIDER_SITE_OTHER): Payer: 59

## 2017-06-13 DIAGNOSIS — N281 Cyst of kidney, acquired: Secondary | ICD-10-CM | POA: Diagnosis not present

## 2017-06-13 DIAGNOSIS — K760 Fatty (change of) liver, not elsewhere classified: Secondary | ICD-10-CM

## 2017-06-13 MED ORDER — IOPAMIDOL (ISOVUE-300) INJECTION 61%
100.0000 mL | Freq: Once | INTRAVENOUS | Status: AC | PRN
  Administered 2017-06-13: 100 mL via INTRAVENOUS

## 2017-06-27 ENCOUNTER — Other Ambulatory Visit: Payer: Self-pay | Admitting: Family Medicine

## 2017-07-24 ENCOUNTER — Other Ambulatory Visit: Payer: Self-pay | Admitting: Family Medicine

## 2017-07-25 ENCOUNTER — Other Ambulatory Visit: Payer: Self-pay | Admitting: Family Medicine

## 2017-08-17 ENCOUNTER — Other Ambulatory Visit: Payer: Self-pay | Admitting: Family Medicine

## 2017-08-26 ENCOUNTER — Other Ambulatory Visit: Payer: Self-pay | Admitting: Family Medicine

## 2017-09-10 ENCOUNTER — Other Ambulatory Visit: Payer: Self-pay | Admitting: Family Medicine

## 2017-09-15 ENCOUNTER — Other Ambulatory Visit: Payer: Self-pay

## 2017-09-15 MED ORDER — LISINOPRIL 2.5 MG PO TABS: 2.5000 mg | ORAL_TABLET | Freq: Every day | ORAL | 0 refills | Status: DC

## 2017-09-20 ENCOUNTER — Ambulatory Visit (INDEPENDENT_AMBULATORY_CARE_PROVIDER_SITE_OTHER): Payer: 59 | Admitting: Family Medicine

## 2017-09-20 ENCOUNTER — Encounter: Payer: Self-pay | Admitting: Family Medicine

## 2017-09-20 VITALS — BP 113/66 | HR 86 | Wt 209.0 lb

## 2017-09-20 DIAGNOSIS — Z0000 Encounter for general adult medical examination without abnormal findings: Secondary | ICD-10-CM | POA: Diagnosis not present

## 2017-09-20 DIAGNOSIS — E1121 Type 2 diabetes mellitus with diabetic nephropathy: Secondary | ICD-10-CM

## 2017-09-20 DIAGNOSIS — Z23 Encounter for immunization: Secondary | ICD-10-CM | POA: Diagnosis not present

## 2017-09-20 LAB — POCT GLYCOSYLATED HEMOGLOBIN (HGB A1C): Hemoglobin A1C: 6.5

## 2017-09-20 MED ORDER — CANAGLIFLOZIN 100 MG PO TABS: 100.0000 mg | ORAL_TABLET | Freq: Every day | ORAL | 1 refills | Status: DC

## 2017-09-20 MED ORDER — SAXAGLIPTIN-METFORMIN ER 2.5-1000 MG PO TB24: 1.0000 | ORAL_TABLET | Freq: Two times a day (BID) | ORAL | 1 refills | Status: DC

## 2017-09-20 NOTE — Patient Instructions (Addendum)
Thank you for coming in today. Recheck diabetes in 5 months.  Return sooner if needed.   Continue current lifestyle.   Let me know about the Shingrix vaccine.

## 2017-09-20 NOTE — Progress Notes (Signed)
Cristian Watkins is a 56 y.o. male who presents to [place]: [place] today for well adult visit.  Cristian Watkins is doing well overall.  He gets exercise with golfing and tries eat a lower carb feels well with no issues.  No fevers chills nausea vomiting.    Past Medical History:  Diagnosis Date  . Diabetes [facility])    Past Surgical History:  Procedure Laterality Date  . rotator cuff surgeries  8251,8984   Social History  Substance Use Topics  . Smoking status: Never Smoker  . Smokeless tobacco: Never Used  . Alcohol use Yes   family history includes Prostate cancer in his father.  ROS as above:  Medications: Current Outpatient Prescriptions  Medication Sig Dispense Refill  . AMBULATORY NON FORMULARY MEDICATION lancets, test strips of choice for testing 2x daily E11.29 Uncontrolled diabetes 1 each 0  . canagliflozin (INVOKANA) 100 MG TABS tablet Take 1 tablet (100 mg total) by mouth daily before breakfast. 90 tablet 1  . lisinopril (PRINIVIL,ZESTRIL) 2.5 MG tablet Take 1 tablet (2.5 mg total) by mouth daily. Due for follow up visit 90 tablet 0  . Saxagliptin-Metformin (KOMBIGLYZE XR) 2.03-999 MG TB24 Take 1 tablet by mouth 2 (two) times daily. 180 tablet 1   No current facility-administered medications for this visit.    No Known Allergies  Health Maintenance Health Maintenance  Topic Date Due  . INFLUENZA VACCINE  06/22/2017  . FOOT EXAM  10/08/2017  . HEMOGLOBIN A1C  12/07/2017  . OPHTHALMOLOGY EXAM  04/11/2018  . COLONOSCOPY  07/24/2020  . PNEUMOCOCCAL POLYSACCHARIDE VACCINE (2) 10/08/2021  . TETANUS/TDAP  07/09/2024  . Hepatitis C Screening  Completed  . HIV Screening  Completed     Exam:  BP 113/66   Pulse 86   Wt 209 lb (94.8 kg)   BMI 29.99 kg/m   Wt Readings from Last 5 Encounters:  09/20/17 209 lb (94.8 kg)  06/08/17 210 lb (95.3 kg)  01/03/17  214 lb (97.1 kg)  12/07/16 212 lb (96.2 kg)  11/30/16 206 lb (93.4 kg)    Gen: Well NAD HEENT: EOMI,  MMM Lungs: Normal work of breathing. CTABL Heart: RRR no MRG Abd: NABS, Soft. Nondistended, Nontender Exts: Brisk capillary refill, warm and well perfused.    Results for orders placed or performed in visit on 09/20/17 (from the past 72 hour(s))  POCT HgB A1C     Status: None   Collection Time: 09/20/17  1:22 PM  Result Value Ref Range   Hemoglobin A1C 6.5      Chemistry      Component Value Date/Time   NA 138 06/06/2017 0740   NA 140 12/20/2016   K 4.2 06/06/2017 0740   CL 106 06/06/2017 0740   CO2 21 06/06/2017 0740   BUN 11 06/06/2017 0740   BUN 13 12/20/2016   CREATININE 0.75 06/06/2017 0740   GLU 120 12/20/2016      Component Value Date/Time   CALCIUM 8.9 06/06/2017 0740   ALKPHOS 50 06/06/2017 0740   AST 28 06/06/2017 0740   ALT 50 (H) 06/06/2017 0740   BILITOT 0.5 06/06/2017 0740     Lab Results  Component Value Date   CHOL 128 06/06/2017   HDL 51 06/06/2017   LDLCALC 59 08/12/2016   TRIG 60 06/06/2017   CHOLHDL 2.5 06/06/2017      Assessment and Plan: 56 y.o. male with  Well adult.  Doing  well.  Diabetes well controlled.  Patient is recheck in about 5 months to follow-up diabetes.  Flu vaccine given today prior to discharge.   Orders Placed This Encounter  Procedures  . Flu Vaccine QUAD 36+ mos IM  . POCT HgB A1C   Meds ordered this encounter  Medications  . Saxagliptin-Metformin (KOMBIGLYZE XR) 2.03-999 MG TB24    Sig: Take 1 tablet by mouth 2 (two) times daily.    Dispense:  180 tablet    Refill:  1  . canagliflozin (INVOKANA) 100 MG TABS tablet    Sig: Take 1 tablet (100 mg total) by mouth daily before breakfast.    Dispense:  90 tablet    Refill:  1     Discussed warning signs or symptoms. Please see discharge instructions. Patient expresses understanding.

## 2017-12-13 ENCOUNTER — Other Ambulatory Visit: Payer: Self-pay | Admitting: Family Medicine

## 2018-03-12 ENCOUNTER — Other Ambulatory Visit: Payer: Self-pay | Admitting: Family Medicine

## 2018-04-06 ENCOUNTER — Other Ambulatory Visit: Payer: Self-pay | Admitting: Family Medicine

## 2018-04-15 ENCOUNTER — Other Ambulatory Visit: Payer: Self-pay | Admitting: Family Medicine

## 2018-05-03 ENCOUNTER — Other Ambulatory Visit: Payer: Self-pay | Admitting: Family Medicine

## 2018-05-21 ENCOUNTER — Other Ambulatory Visit: Payer: Self-pay | Admitting: Family Medicine

## 2018-05-25 ENCOUNTER — Other Ambulatory Visit: Payer: Self-pay | Admitting: Family Medicine

## 2018-06-22 ENCOUNTER — Other Ambulatory Visit: Payer: Self-pay | Admitting: Family Medicine

## 2018-06-24 ENCOUNTER — Other Ambulatory Visit: Payer: Self-pay | Admitting: Family Medicine

## 2018-07-17 ENCOUNTER — Ambulatory Visit (INDEPENDENT_AMBULATORY_CARE_PROVIDER_SITE_OTHER): Payer: 59 | Admitting: Family Medicine

## 2018-07-17 ENCOUNTER — Encounter: Payer: Self-pay | Admitting: Family Medicine

## 2018-07-17 VITALS — BP 141/84 | HR 74 | Ht 70.0 in | Wt 217.0 lb

## 2018-07-17 DIAGNOSIS — E1121 Type 2 diabetes mellitus with diabetic nephropathy: Secondary | ICD-10-CM | POA: Diagnosis not present

## 2018-07-17 DIAGNOSIS — G4700 Insomnia, unspecified: Secondary | ICD-10-CM

## 2018-07-17 DIAGNOSIS — K7581 Nonalcoholic steatohepatitis (NASH): Secondary | ICD-10-CM | POA: Diagnosis not present

## 2018-07-17 DIAGNOSIS — Z8669 Personal history of other diseases of the nervous system and sense organs: Secondary | ICD-10-CM

## 2018-07-17 DIAGNOSIS — Z23 Encounter for immunization: Secondary | ICD-10-CM

## 2018-07-17 DIAGNOSIS — R7982 Elevated C-reactive protein (CRP): Secondary | ICD-10-CM | POA: Diagnosis not present

## 2018-07-17 DIAGNOSIS — I10 Essential (primary) hypertension: Secondary | ICD-10-CM

## 2018-07-17 LAB — POCT GLYCOSYLATED HEMOGLOBIN (HGB A1C): Hemoglobin A1C: 7.3 % — AB (ref 4.0–5.6)

## 2018-07-17 MED ORDER — LISINOPRIL 2.5 MG PO TABS: 2.5000 mg | ORAL_TABLET | Freq: Every day | ORAL | 0 refills | Status: DC

## 2018-07-17 MED ORDER — CANAGLIFLOZIN 100 MG PO TABS: ORAL_TABLET | ORAL | 3 refills | Status: DC

## 2018-07-17 MED ORDER — SAXAGLIPTIN-METFORMIN ER 2.5-1000 MG PO TB24: ORAL_TABLET | ORAL | 3 refills | Status: DC

## 2018-07-17 MED ORDER — LISINOPRIL 2.5 MG PO TABS: 2.5000 mg | ORAL_TABLET | Freq: Every day | ORAL | 3 refills | Status: DC

## 2018-07-17 NOTE — Progress Notes (Signed)
Cristian Watkins is a 57 y.o. male who presents to [place]: [place] today for meds follow up.   Diabetes: [name] takes medications listed below with the exception of Invokana.  Has been out of Invokana for over a month now.  He does not check his blood sugars regularly.  He denies polyuria or polydipsia.  Hypertension: [name]  has been out of his Toprol for about a month as well.  No chest pain palpitations shortness of breath.  He is also having some trouble with this sleep. He wakes up about every two hours but is able to fall right back to sleep. He says he only needs to use the bathroom one of these times during the night. He says he snores and has not had a sleep study in the past 20 years. He is not tired during the day.  He notes that he thinks he may have been diagnosed with sleep apnea years ago but is not sure.  He notes that he snores and sometimes feels tired the next day.  STOP BANG: Snore:     yes Tired:     yes Observed stop breathing:  no Hypertension:   yes  BMI >35:   no Age >50:   yes Neck > 16 inches:  yes Male gender:   yes ------------------------------------------ Total:     6/8    ROS as above:  Exam:  BP (!) 141/84   Pulse 74   Ht 5' 10"  (1.778 m)   Wt 217 lb (98.4 kg)   BMI 31.14 kg/m  Wt Readings from Last 5 Encounters:  07/17/18 217 lb (98.4 kg)  09/20/17 209 lb (94.8 kg)  06/08/17 210 lb (95.3 kg)  01/03/17 214 lb (97.1 kg)  12/07/16 212 lb (96.2 kg)    Gen: Well NAD HEENT: EOMI,  MMM Lungs: Normal work of breathing. CTABL Heart: RRR no MRG Abd: NABS, Soft. Nondistended, Nontender Exts: Brisk capillary refill, warm and well perfused.   Lab and Radiology Results Results for orders placed or performed in visit on 07/17/18 (from the past 72 hour(s))  POCT HgB A1C     Status: Abnormal   Collection Time: 07/17/18  2:20 PM    Result Value Ref Range   Hemoglobin A1C 7.3 (A) 4.0 - 5.6 %   HbA1c POC (<> result, manual entry)     HbA1c, POC (prediabetic range)     HbA1c, POC (controlled diabetic range)     No results found.    Assessment and Plan: 57 y.o. male with diabetes and sleep trouble.  Diabetes: Worsened recently likely as he ran out of medication.  Restart medications and recheck in about 3 months.  Recommend diabetes eye exam.  Hypertension: Blood pressure not controlled.  Restart lisinopril.  Recheck 3 months. . We will also order regular labs on him including CMC, CMP, lipid panel.   He is also having trouble sleeping. This is likely multifactorial with effects from poor sleep hygiene and also sleep apnea is possible. He snores and his wife is concerned. He has not had a sleep study in 20 years. We will order a home sleep study to assess for OSA. He will focus on practicing good sleep hygiene in the meantime.   Influenza vaccine given today   Follow-up other issues including history of transaminitis and lipids with labs listed below.  Orders Placed This Encounter  Procedures  . Flu Vaccine QUAD 36+ mos  IM    [place]  . CBC  . COMPLETE METABOLIC PANEL WITH GFR  . Lipid Panel w/reflex Direct LDL  . POCT HgB A1C  . Home sleep test    Standing Status:   Future    Standing Expiration Date:   07/18/2019    Order Specific Question:   Where should this test be performed:    Answer:   Other   Meds ordered this encounter  Medications  . DISCONTD: lisinopril (PRINIVIL,ZESTRIL) 2.5 MG tablet    Sig: Take 1 tablet (2.5 mg total) by mouth daily.    Dispense:  90 tablet    Refill:  0  . Saxagliptin-Metformin (KOMBIGLYZE XR) 2.03-999 MG TB24    Sig: TAKE 1 TABLET BY MOUTH 2 (TWO) TIMES DAILY.    Dispense:  90 tablet    Refill:  3  . DISCONTD: canagliflozin (INVOKANA) 100 MG TABS tablet    Sig: TAKE 1 TABLET (100 MG TOTAL) BY MOUTH DAILY BEFORE BREAKFAST.    Dispense:  90 tablet    Refill:  3   . canagliflozin (INVOKANA) 100 MG TABS tablet    Sig: TAKE 1 TABLET (100 MG TOTAL) BY MOUTH DAILY BEFORE BREAKFAST.    Dispense:  90 tablet    Refill:  3  . lisinopril (PRINIVIL,ZESTRIL) 2.5 MG tablet    Sig: Take 1 tablet (2.5 mg total) by mouth daily.    Dispense:  90 tablet    Refill:  3     Historical information moved to improve visibility of documentation.  Past Medical History:  Diagnosis Date  . Diabetes [facility])    Past Surgical History:  Procedure Laterality Date  . rotator cuff surgeries  8676,1950   Social History   Tobacco Use  . Smoking status: Never Smoker  . Smokeless tobacco: Never Used  Substance Use Topics  . Alcohol use: Yes   family history includes Prostate cancer in his father.  Medications: Current Outpatient Medications  Medication Sig Dispense Refill  . AMBULATORY NON FORMULARY MEDICATION lancets, test strips of choice for testing 2x daily E11.29 Uncontrolled diabetes 1 each 0  . canagliflozin (INVOKANA) 100 MG TABS tablet TAKE 1 TABLET (100 MG TOTAL) BY MOUTH DAILY BEFORE BREAKFAST. 90 tablet 3  . lisinopril (PRINIVIL,ZESTRIL) 2.5 MG tablet Take 1 tablet (2.5 mg total) by mouth daily. 90 tablet 3  . Saxagliptin-Metformin (KOMBIGLYZE XR) 2.03-999 MG TB24 TAKE 1 TABLET BY MOUTH 2 (TWO) TIMES DAILY. 90 tablet 3   No current facility-administered medications for this visit.    No Known Allergies   Discussed warning signs or symptoms. Please see discharge instructions. Patient expresses understanding.  I personally was present and performed or re-performed the history, physical exam and medical decision-making activities of this service and have verified that the service and findings are accurately documented in the student's note. ___________________________________________ Lynne Leader M.D., ABFM., CAQSM. Primary Care and Sports Medicine Adjunct Instructor of [employer]

## 2018-07-17 NOTE — Patient Instructions (Addendum)
Thank you for coming in today. Restart medicine.  You should hear about sleep study.  Let me know if you do not hear anything within in 1 week.   Get fasting labs in the near future.   Recheck in 3 months for well adult visit.    Get eye exam soon.

## 2018-08-02 LAB — COMPLETE METABOLIC PANEL WITH GFR
AG Ratio: 1.8 (calc) (ref 1.0–2.5)
ALBUMIN MSPROF: 4.6 g/dL (ref 3.6–5.1)
ALKALINE PHOSPHATASE (APISO): 45 U/L (ref 40–115)
ALT: 79 U/L — ABNORMAL HIGH (ref 9–46)
AST: 49 U/L — ABNORMAL HIGH (ref 10–35)
BILIRUBIN TOTAL: 0.9 mg/dL (ref 0.2–1.2)
BUN: 18 mg/dL (ref 7–25)
CHLORIDE: 101 mmol/L (ref 98–110)
CO2: 24 mmol/L (ref 20–32)
Calcium: 9.5 mg/dL (ref 8.6–10.3)
Creat: 0.86 mg/dL (ref 0.70–1.33)
GFR, Est African American: 111 mL/min/{1.73_m2} (ref >=60)
GFR, Est Non African American: 96 mL/min/{1.73_m2} (ref >=60)
GLOBULIN: 2.5 g/dL (ref 1.9–3.7)
GLUCOSE: 132 mg/dL — AB (ref 65–99)
Potassium: 3.9 mmol/L (ref 3.5–5.3)
SODIUM: 138 mmol/L (ref 135–146)
Total Protein: 7.1 g/dL (ref 6.1–8.1)

## 2018-08-02 LAB — CBC
HEMATOCRIT: 47.9 % (ref 38.5–50.0)
HEMOGLOBIN: 16.1 g/dL (ref 13.2–17.1)
MCH: 27.9 pg (ref 27.0–33.0)
MCHC: 33.6 g/dL (ref 32.0–36.0)
MCV: 83 fL (ref 80.0–100.0)
MPV: 10.9 fL (ref 7.5–12.5)
Platelets: 207 10*3/uL (ref 140–400)
RBC: 5.77 10*6/uL (ref 4.20–5.80)
RDW: 13.8 % (ref 11.0–15.0)
WBC: 6.5 10*3/uL (ref 3.8–10.8)

## 2018-08-02 LAB — LIPID PANEL W/REFLEX DIRECT LDL
CHOLESTEROL: 156 mg/dL (ref <200)
HDL: 53 mg/dL (ref >40)
LDL Cholesterol (Calc): 84 mg/dL (calc)
Non-HDL Cholesterol (Calc): 103 mg/dL (calc) (ref <130)
Total CHOL/HDL Ratio: 2.9 (calc) (ref <5.0)
Triglycerides: 92 mg/dL (ref <150)

## 2018-08-03 DIAGNOSIS — E1110 Type 2 diabetes mellitus with ketoacidosis without coma: Secondary | ICD-10-CM | POA: Insufficient documentation

## 2018-08-03 DIAGNOSIS — E119 Type 2 diabetes mellitus without complications: Secondary | ICD-10-CM | POA: Insufficient documentation

## 2018-08-05 LAB — BASIC METABOLIC PANEL
BUN: 11 (ref 4–21)
CREATININE: 0.6 (ref 0.6–1.3)
Glucose: 138
Potassium: 4 (ref 3.4–5.3)
SODIUM: 136 — AB (ref 137–147)

## 2018-08-05 LAB — HEMOGLOBIN A1C: Hemoglobin A1C: 7.7 % — AB (ref 4.0–5.6)

## 2018-08-07 MED ORDER — SODIUM CHLORIDE 0.9 % IV SOLN
10.00 | INTRAVENOUS | Status: DC
Start: ? — End: 2018-08-07

## 2018-08-07 MED ORDER — ALUM & MAG HYDROXIDE-SIMETH 200-200-20 MG/5ML PO SUSP
30.00 | ORAL | Status: DC
Start: ? — End: 2018-08-07

## 2018-08-07 MED ORDER — LISINOPRIL 5 MG PO TABS
2.50 | ORAL_TABLET | ORAL | Status: DC
Start: 2018-08-06 — End: 2018-08-07

## 2018-08-07 MED ORDER — GENERIC EXTERNAL MEDICATION
1.00 | Status: DC
Start: ? — End: 2018-08-07

## 2018-08-07 MED ORDER — ENOXAPARIN SODIUM 40 MG/0.4ML ~~LOC~~ SOLN
40.00 | SUBCUTANEOUS | Status: DC
Start: 2018-08-05 — End: 2018-08-07

## 2018-08-07 MED ORDER — NITROGLYCERIN 0.4 MG SL SUBL
0.40 | SUBLINGUAL_TABLET | SUBLINGUAL | Status: DC
Start: ? — End: 2018-08-07

## 2018-08-07 MED ORDER — GENERIC EXTERNAL MEDICATION
Status: DC
Start: 2018-08-06 — End: 2018-08-07

## 2018-08-07 MED ORDER — PROMETHAZINE HCL 25 MG/ML IJ SOLN
12.50 | INTRAMUSCULAR | Status: DC
Start: ? — End: 2018-08-07

## 2018-08-07 MED ORDER — GENERIC EXTERNAL MEDICATION
10.00 | Status: DC
Start: ? — End: 2018-08-07

## 2018-08-07 MED ORDER — GUAIFENESIN 100 MG/5ML PO SYRP
200.00 | ORAL_SOLUTION | ORAL | Status: DC
Start: ? — End: 2018-08-07

## 2018-08-07 MED ORDER — HYDRALAZINE HCL 20 MG/ML IJ SOLN
10.00 | INTRAMUSCULAR | Status: DC
Start: ? — End: 2018-08-07

## 2018-08-07 MED ORDER — GENERIC EXTERNAL MEDICATION
4.00 | Status: DC
Start: ? — End: 2018-08-07

## 2018-08-07 MED ORDER — MUPIROCIN 2 % EX OINT
.25 | TOPICAL_OINTMENT | CUTANEOUS | Status: DC
Start: 2018-08-05 — End: 2018-08-07

## 2018-08-07 MED ORDER — GENERIC EXTERNAL MEDICATION
650.00 | Status: DC
Start: ? — End: 2018-08-07

## 2018-08-07 MED ORDER — ACETAMINOPHEN 325 MG PO TABS
650.00 | ORAL_TABLET | ORAL | Status: DC
Start: ? — End: 2018-08-07

## 2018-08-07 MED ORDER — ALBUTEROL SULFATE (2.5 MG/3ML) 0.083% IN NEBU
2.50 | INHALATION_SOLUTION | RESPIRATORY_TRACT | Status: DC
Start: ? — End: 2018-08-07

## 2018-08-07 MED ORDER — POLYETHYLENE GLYCOL 3350 17 G PO PACK
17.00 | PACK | ORAL | Status: DC
Start: ? — End: 2018-08-07

## 2018-08-08 ENCOUNTER — Ambulatory Visit (INDEPENDENT_AMBULATORY_CARE_PROVIDER_SITE_OTHER): Payer: 59 | Admitting: Family Medicine

## 2018-08-08 ENCOUNTER — Encounter: Payer: Self-pay | Admitting: Family Medicine

## 2018-08-08 VITALS — BP 130/82 | HR 84 | Wt 207.0 lb

## 2018-08-08 DIAGNOSIS — E1121 Type 2 diabetes mellitus with diabetic nephropathy: Secondary | ICD-10-CM

## 2018-08-08 DIAGNOSIS — E872 Acidosis, unspecified: Secondary | ICD-10-CM

## 2018-08-08 NOTE — Patient Instructions (Addendum)
Thank you for coming in today. Continue Kombiglyze Continue normal life.  Try to get fewer than 60g of carbs per meal.   Recheck as scheduled on Nov 4th.  I will fill out forms if needed.   Dulaglutide injection What is this medicine? DULAGLUTIDE (DOO la GLOO tide) is used to improve blood sugar control in adults with type 2 diabetes. This medicine may be used with other oral diabetes medicines. This medicine may be used for other purposes; ask your health care provider or pharmacist if you have questions. COMMON BRAND NAME(S): TRULICITY What should I tell my health care provider before I take this medicine? They need to know if you have any of these conditions: -endocrine tumors (MEN 2) or if someone in your family had these tumors -history of pancreatitis -kidney disease -liver disease -stomach problems -thyroid cancer or if someone in your family had thyroid cancer -an unusual or allergic reaction to dulaglutide, other medicines, foods, dyes, or preservatives -pregnant or trying to get pregnant -breast-feeding How should I use this medicine? This medicine is for injection under the skin of your upper leg (thigh), stomach area, or upper arm. It is usually given once every week (every 7 days). You will be taught how to prepare and give this medicine. Use exactly as directed. Take your medicine at regular intervals. Do not take it more often than directed. If you use this medicine with insulin, you should inject this medicine and the insulin separately. Do not mix them together. Do not give the injections right next to each other. Change (rotate) injection sites with each injection. It is important that you put your used needles and syringes in a special sharps container. Do not put them in a trash can. If you do not have a sharps container, call your pharmacist or healthcare provider to get one. A special MedGuide will be given to you by the pharmacist with each prescription and refill.  Be sure to read this information carefully each time. Talk to your pediatrician regarding the use of this medicine in children. Special care may be needed. Overdosage: If you think you have taken too much of this medicine contact a poison control center or emergency room at once. NOTE: This medicine is only for you. Do not share this medicine with others. What if I miss a dose? If you miss a dose, take it as soon as you can within 3 days after the missed dose. Then take your next dose at your regular weekly time. If it has been longer than 3 days after the missed dose, do not take the missed dose. Take the next dose at your regular time. Do not take double or extra doses. If you have questions about a missed dose, contact your health care provider for advice. What may interact with this medicine? -other medicines for diabetes Many medications may cause changes in blood sugar, these include: -alcohol containing beverages -antiviral medicines for HIV or AIDS -aspirin and aspirin-like drugs -certain medicines for blood pressure, heart disease, irregular heart beat -chromium -diuretics -male hormones, such as estrogens or progestins, birth control pills -fenofibrate -gemfibrozil -isoniazid -lanreotide -male hormones or anabolic steroids -MAOIs like Carbex, Eldepryl, Marplan, Nardil, and Parnate -medicines for weight loss -medicines for allergies, asthma, cold, or cough -medicines for depression, anxiety, or psychotic disturbances -niacin -nicotine -NSAIDs, medicines for pain and inflammation, like ibuprofen or naproxen -octreotide -pasireotide -pentamidine -phenytoin -probenecid -quinolone antibiotics such as ciprofloxacin, levofloxacin, ofloxacin -some herbal dietary supplements -steroid medicines such as  prednisone or cortisone -sulfamethoxazole; trimethoprim -thyroid hormones Some medications can hide the warning symptoms of low blood sugar (hypoglycemia). You may need to  monitor your blood sugar more closely if you are taking one of these medications. These include: -beta-blockers, often used for high blood pressure or heart problems (examples include atenolol, metoprolol, propranolol) -clonidine -guanethidine -reserpine This list may not describe all possible interactions. Give your health care provider a list of all the medicines, herbs, non-prescription drugs, or dietary supplements you use. Also tell them if you smoke, drink alcohol, or use illegal drugs. Some items may interact with your medicine. What should I watch for while using this medicine? Visit your doctor or health care professional for regular checks on your progress. Drink plenty of fluids while taking this medicine. Check with your doctor or health care professional if you get an attack of severe diarrhea, nausea, and vomiting. The loss of too much body fluid can make it dangerous for you to take this medicine. A test called the HbA1C (A1C) will be monitored. This is a simple blood test. It measures your blood sugar control over the last 2 to 3 months. You will receive this test every 3 to 6 months. Learn how to check your blood sugar. Learn the symptoms of low and high blood sugar and how to manage them. Always carry a quick-source of sugar with you in case you have symptoms of low blood sugar. Examples include hard sugar candy or glucose tablets. Make sure others know that you can choke if you eat or drink when you develop serious symptoms of low blood sugar, such as seizures or unconsciousness. They must get medical help at once. Tell your doctor or health care professional if you have high blood sugar. You might need to change the dose of your medicine. If you are sick or exercising more than usual, you might need to change the dose of your medicine. Do not skip meals. Ask your doctor or health care professional if you should avoid alcohol. Many nonprescription cough and cold products contain sugar  or alcohol. These can affect blood sugar. Pens should never be shared. Even if the needle is changed, sharing may result in passing of viruses like hepatitis or HIV. Wear a medical ID bracelet or chain, and carry a card that describes your disease and details of your medicine and dosage times. What side effects may I notice from receiving this medicine? Side effects that you should report to your doctor or health care professional as soon as possible: -allergic reactions like skin rash, itching or hives, swelling of the face, lips, or tongue -breathing problems -diarrhea that continues or is severe -lump or swelling on the neck -severe nausea -signs and symptoms of infection like fever or chills; cough; sore throat; pain or trouble passing urine -signs and symptoms of low blood sugar such as feeling anxious, confusion, dizziness, increased hunger, unusually weak or tired, sweating, shakiness, cold, irritable, headache, blurred vision, fast heartbeat, loss of consciousness -signs and symptoms of kidney injury like trouble passing urine or change in the amount of urine -trouble swallowing -unusual stomach upset or pain -vomiting Side effects that usually do not require medical attention (report to your doctor or health care professional if they continue or are bothersome): -diarrhea -loss of appetite -nausea -pain, redness, or irritation at site where injected -stomach upset This list may not describe all possible side effects. Call your doctor for medical advice about side effects. You may report side effects to  FDA at 1-800-FDA-1088. Where should I keep my medicine? Keep out of the reach of children. Store unopened pens in a refrigerator between 2 and 8 degrees C (36 and 46 degrees F). Do not freeze or use if the medicine has been frozen. Protect from light and excessive heat. Store in the carton until use. Each single-dose pen can be kept at room temperature, not to exceed 30 degrees C (86  degrees F) for a total of 14 days, if needed. Throw away any unused medicine after the expiration date on the label. NOTE: This sheet is a summary. It may not cover all possible information. If you have questions about this medicine, talk to your doctor, pharmacist, or health care provider.  2018 [employer] Standard (2016-11-25 14:35:01)   Semaglutide injection solution What is this medicine? SEMAGLUTIDE (Sem a GLOO tide) is used to improve blood sugar control in adults with type 2 diabetes. This medicine may be used with other diabetes medicines. This medicine may be used for other purposes; ask your health care provider or pharmacist if you have questions. COMMON BRAND NAME(S): OZEMPIC What should I tell my health care provider before I take this medicine? They need to know if you have any of these conditions: -endocrine tumors (MEN 2) or if someone in your family had these tumors -eye disease, vision problems -history of pancreatitis -kidney disease -stomach problems -thyroid cancer or if someone in your family had thyroid cancer -an unusual or allergic reaction to semaglutide, other medicines, foods, dyes, or preservatives -pregnant or trying to get pregnant -breast-feeding How should I use this medicine? This medicine is for injection under the skin of your upper leg (thigh), stomach area, or upper arm. It is given once every week (every 7 days). You will be taught how to prepare and give this medicine. Use exactly as directed. Take your medicine at regular intervals. Do not take it more often than directed. If you use this medicine with insulin, you should inject this medicine and the insulin separately. Do not mix them together. Do not give the injections right next to each other. Change (rotate) injection sites with each injection. It is important that you put your used needles and syringes in a special sharps container. Do not put them in a trash can. If you do not have a sharps  container, call your pharmacist or healthcare provider to get one. A special MedGuide will be given to you by the pharmacist with each prescription and refill. Be sure to read this information carefully each time. Talk to your pediatrician regarding the use of this medicine in children. Special care may be needed. Overdosage: If you think you have taken too much of this medicine contact a poison control center or emergency room at once. NOTE: This medicine is only for you. Do not share this medicine with others. What if I miss a dose? If you miss a dose, take it as soon as you can within 5 days after the missed dose. Then take your next dose at your regular weekly time. If it has been longer than 5 days after the missed dose, do not take the missed dose. Take the next dose at your regular time. Do not take double or extra doses. If you have questions about a missed dose, contact your health care provider for advice. What may interact with this medicine? -other medicines for diabetes Many medications may cause changes in blood sugar, these include: -alcohol containing beverages -antiviral medicines for HIV or AIDS -  aspirin and aspirin-like drugs -certain medicines for blood pressure, heart disease, irregular heart beat -chromium -diuretics -male hormones, such as estrogens or progestins, birth control pills -fenofibrate -gemfibrozil -isoniazid -lanreotide -male hormones or anabolic steroids -MAOIs like Carbex, Eldepryl, Marplan, Nardil, and Parnate -medicines for weight loss -medicines for allergies, asthma, cold, or cough -medicines for depression, anxiety, or psychotic disturbances -niacin -nicotine -NSAIDs, medicines for pain and inflammation, like ibuprofen or naproxen -octreotide -pasireotide -pentamidine -phenytoin -probenecid -quinolone antibiotics such as ciprofloxacin, levofloxacin, ofloxacin -some herbal dietary supplements -steroid medicines such as prednisone or  cortisone -sulfamethoxazole; trimethoprim -thyroid hormones Some medications can hide the warning symptoms of low blood sugar (hypoglycemia). You may need to monitor your blood sugar more closely if you are taking one of these medications. These include: -beta-blockers, often used for high blood pressure or heart problems (examples include atenolol, metoprolol, propranolol) -clonidine -guanethidine -reserpine This list may not describe all possible interactions. Give your health care provider a list of all the medicines, herbs, non-prescription drugs, or dietary supplements you use. Also tell them if you smoke, drink alcohol, or use illegal drugs. Some items may interact with your medicine. What should I watch for while using this medicine? Visit your doctor or health care professional for regular checks on your progress. Drink plenty of fluids while taking this medicine. Check with your doctor or health care professional if you get an attack of severe diarrhea, nausea, and vomiting. The loss of too much body fluid can make it dangerous for you to take this medicine. A test called the HbA1C (A1C) will be monitored. This is a simple blood test. It measures your blood sugar control over the last 2 to 3 months. You will receive this test every 3 to 6 months. Learn how to check your blood sugar. Learn the symptoms of low and high blood sugar and how to manage them. Always carry a quick-source of sugar with you in case you have symptoms of low blood sugar. Examples include hard sugar candy or glucose tablets. Make sure others know that you can choke if you eat or drink when you develop serious symptoms of low blood sugar, such as seizures or unconsciousness. They must get medical help at once. Tell your doctor or health care professional if you have high blood sugar. You might need to change the dose of your medicine. If you are sick or exercising more than usual, you might need to change the dose of your  medicine. Do not skip meals. Ask your doctor or health care professional if you should avoid alcohol. Many nonprescription cough and cold products contain sugar or alcohol. These can affect blood sugar. Pens should never be shared. Even if the needle is changed, sharing may result in passing of viruses like hepatitis or HIV. Wear a medical ID bracelet or chain, and carry a card that describes your disease and details of your medicine and dosage times. What side effects may I notice from receiving this medicine? Side effects that you should report to your doctor or health care professional as soon as possible: -allergic reactions like skin rash, itching or hives, swelling of the face, lips, or tongue -breathing problems -changes in vision -diarrhea that continues or is severe -lump or swelling on the neck -severe nausea -signs and symptoms of infection like fever or chills; cough; sore throat; pain or trouble passing urine -signs and symptoms of low blood sugar such as feeling anxious, confusion, dizziness, increased hunger, unusually weak or tired, sweating, shakiness, cold,  irritable, headache, blurred vision, fast heartbeat, loss of consciousness -signs and symptoms of kidney injury like trouble passing urine or change in the amount of urine -trouble swallowing -unusual stomach upset or pain -vomiting Side effects that usually do not require medical attention (report to your doctor or health care professional if they continue or are bothersome): -constipation -diarrhea -nausea -pain, redness, or irritation at site where injected -stomach upset This list may not describe all possible side effects. Call your doctor for medical advice about side effects. You may report side effects to FDA at 1-800-FDA-1088. Where should I keep my medicine? Keep out of the reach of children. Store unopened pens in a refrigerator between 2 and 8 degrees C (36 and 46 degrees F). Do not freeze. Protect from  light and heat. After you first use the pen, it can be stored for 56 days at room temperature between 15 and 30 degrees C (59 and 86 degrees F) or in a refrigerator. Throw away your used pen after 56 days or after the expiration date, whichever comes first. Do not store your pen with the needle attached. If the needle is left on, medicine may leak from the pen. NOTE: This sheet is a summary. It may not cover all possible information. If you have questions about this medicine, talk to your doctor, pharmacist, or health care provider.  2018 [employer] Standard (2016-11-25 14:43:35)

## 2018-08-08 NOTE — Progress Notes (Signed)
Cristian Watkins is a 57 y.o. male who presents to [place] [facility]: [facility] today for hospital follow-up.  Ayeden was hospitalized for acidosis without hyperglycemia recently thought to be due to DKA.  He was hospitalized from September 12 to September 14.  On admission he had a large anion gap without hyperglycemia.  His pH was significantly acidosis around 7.1.  He was stabilized with IV fluids dextrose and insulin and discharged back on his home medications after discontinuing Invokana.  Invokana was thought to be the ultimate cause of the acidosis.  He is feeling a lot better now notes that his blood sugars typically reasonably well-controlled in the 150s to 170s.  He continues to take his medications listed below.  No chest pain palpitations shortness of breath.  Discharge summary will be attached below. He notes that a few days prior to his admission he had started using a ketogenic diet.  His symptoms on admission were fatigue and vomiting.  He has resumed a normal diet.   ROS as above:  Exam:  BP 130/82   Pulse 84   Wt 207 lb (93.9 kg)   BMI 29.70 kg/m  Wt Readings from Last 5 Encounters:  08/08/18 207 lb (93.9 kg)  07/17/18 217 lb (98.4 kg)  09/20/17 209 lb (94.8 kg)  06/08/17 210 lb (95.3 kg)  01/03/17 214 lb (97.1 kg)    Gen: Well NAD HEENT: EOMI,  MMM Lungs: Normal work of breathing. CTABL Heart: RRR no MRG Abd: NABS, Soft. Nondistended, Nontender Exts: Brisk capillary refill, warm and well perfused.   Lab and Radiology Results Labs from [facility] reviewed below and will be sent to abstract.   Assessment and Plan: 57 y.o. male with  Acidosis without hyperglycemia.  I am not clear if this truly was DKA or some other metabolic acidosis.  Invokana is the most likely culprit which is reasonable.  Plan to discontinue Invokana and avoid SGLT2 class in the  future.  Plan to continue current regimen.  It seems to be well controlled and doing well following his hospitalization.  Labs normalized prior to discharge.  However his diabetes is not going to be well controlled.  His A1c currently is 7.7 and 1 of his diabetes medications was just discontinued.  We discussed GLP 1 class and how it works.  Plan to recheck in about 3 months.  If A1c still elevated will start likely Trulicity.  Work hard on a lower carbohydrate diet.  Plan for 60 g of carbs or fewer per meals.  Recheck as scheduled November 4.  Return sooner if needed.  Patient also due for diabetic eye exam.  Will fill out term disability form needed.  Return to work September 23.  No orders of the defined types were placed in this encounter.  No orders of the defined types were placed in this encounter.    Historical information moved to improve visibility of documentation.  Past Medical History:  Diagnosis Date  . Diabetes [facility])    Past Surgical History:  Procedure Laterality Date  . rotator cuff surgeries  [id number]   Social History   Tobacco Use  . Smoking status: Never Smoker  . Smokeless tobacco: Never Used  Substance Use Topics  . Alcohol use: Yes   family history includes Prostate cancer in his father.  Medications: Current Outpatient Medications  Medication Sig Dispense Refill  . AMBULATORY NON FORMULARY MEDICATION lancets, test strips of choice  for testing 2x daily E11.29 Uncontrolled diabetes 1 each 0  . lisinopril (PRINIVIL,ZESTRIL) 2.5 MG tablet Take 1 tablet (2.5 mg total) by mouth daily. 90 tablet 3  . Saxagliptin-Metformin (KOMBIGLYZE XR) 2.03-999 MG TB24 TAKE 1 TABLET BY MOUTH 2 (TWO) TIMES DAILY. 90 tablet 3   No current facility-administered medications for this visit.    Allergies  Allergen Reactions  . Canagliflozin Other (See Comments)    DKA     Discussed warning signs or symptoms. Please see discharge instructions. Patient expresses  understanding.   Attached discharge summary: Lewie Loron, MD - 08/05/2018 12:30 PM EDT Formatting of this note might be different from the original. [facility] Discharge Summary  PCP: Gregor Hams, MD Discharge Details   Admit date: 08/03/2018 Discharge date: 08/05/2018  Hospital LOS: 2 days  Active Hospital Problems  Diagnosis Date Noted POA  . *DKA, type 2 (*) 08/03/2018 Yes  . Abnormal LFTs (liver function tests) 08/05/2018 Yes  . Hepatic steatosis 08/05/2018 Yes  . Type 2 diabetes mellitus with complication, without long-term current use of insulin (*) 08/03/2018 Yes  . Dehydration 08/03/2018 Yes  . Vomiting 08/03/2018 Yes   Resolved Hospital Problems  No resolved problems to display.   Current Discharge Medication List   DISCONTINUED medications   canagliflozin (INVOKANA) 100 mg tablet    CONTINUED medications  Details  KOMBIGLYZE XR 2.03-999 MG tablet TAKE 1 TABLET BY MOUTH 2 (TWO) TIMES DAILY.   lisinopril (PRINIVIL,ZESTRIL) 2.5 mg tablet Take 2.5 mg by mouth daily.   ONETOUCH ULTRA BLUE test strip USE TO TEST BLOOD SUGAR TWICE A DAY        Hospital Course  Physicians involved in care during this hospitalization Attending Provider: Raquel James, MD Attending Provider: Aida Puffer, MD Attending Provider: Lewie Loron, MD Admitting Provider: Aida Puffer, MD  Indication for Admission: DKA Hospital Course:  57 year old white male with a history of type 2 diabetes mellitus for 14 years presented to the emergency department with several day history of increasing weakness, nausea and onset of vomiting this morning. Patient stated that he woke up in the middle the night and is vomited several times. He denies any abdominal pain fever or diarrhea. No new travel or exposures. He reports compliance with his medication. No prior history of similar symptoms. No sick contacts. No alleviating or  aggravating factors.  In the emergency department patient afebrile and mildly tachycardic with a heart rate of 107, blood pressure initially 162/85. White count 10.8 with hemoglobin 16.8. Troponin and proBNP negative. ALT elevated at 109 and AST of 58. His bicarb was quite low at 9 and anion gap of 26. Additionally positive for ketones but normal lactic acid at 1.0. As a result ABG performed which showed pH 7.1 HgA1c 7.7  ROS pertinent for recently resuming Invokana which he ran out of several weeks or more before. He has not been consistently compliant with his medication. He recently has been on a "low carb ketogenic diet" and occas drinks a beer or 2. He has known severe steatosis on imaging of liver and chronically elevated LFTS  He was admitted and treated per DKA protocol. Anion gap corrected and insulin discontinued. He tolerated po intake without difficulty. He remained clinically stable sitagliptin and metformin will resume at discharge. He was advised to avoid any SGLT2 Medications for his diabetes as this was the likely etiology for his DKA This was discussed with the endocrinologist on call who  recommended resuming his other medications, following up with PCP in one week and close monitoring of glucose as OP  Bedside Procedures  No orders found   [place]   Activity Instructions  Activity as tolerated    Diet Instructions  Consistent Carbohydrate Diet (diabetic)    Other Instructions  Discharge instructions  Please call [phone] if questions regarding hospital stay Lewie Loron, MD 08/05/2018 / 12:29 PM  Follow-up with Primary Care Physician  One week  Reason for referral: hospital follow up DKA  Referral Type: Consultation  Evaluate and Return  Notify Physician for confusion or disorientation  Notify Physician for trouble breathing or symptoms that are worse    Contact information for follow-up   Gregor Hams, MD  Specialty: Family Medicine    Relationship: PCP - General  [facility] [address] [place] [address]  Phone: [phone]   Next Steps: Follow up  Instructions: hospital follow up DKA    Recommendations to physicians: Avoid SGLT2 medications; consider testosterone assessment  Potential for Rehab: Good  Code Status: Full Code  Time spent in discharge process: 40 Minutes required for evaluation, review discharge plan and complete required documentation  Electronically signed: Lewie Loron, MD 08/05/2018 / 2:26 PM  Electronically signed by Lewie Loron, MD at 08/05/2018 2:32 PM EDT   Care Everywhere Result Report Basic Metabolic PanelResulted: 0/93/2671 4:50 AM [facility] Component Name Value Ref Range  Na 136 136 - 146 mmol/L  Potassium 4.0 3.7 - 5.4 mmol/L  Cl 105 97 - 108 mmol/L  CO2 18 (L) 20 - 32 mmol/L  Glucose 138 (H) 65 - 99 mg/dL  BUN 11 6 - 24 mg/dL  Creatinine 0.62 (L) 0.76 - 1.27 mg/dL  Ca 8.2 (L) 8.7 - 10.2 mg/dL  BUN/CREAT RATIO 17.7 11 - 26   GFR AFRICAN AMERICAN 126  Comment: African-American:  Normal GFR (glomerular filtration rate) > 60 mL/min/1.73 meters squared. < 60 may include impaired kidney function based on creatinine, age, legal sex, and race normalized to accepted average body surface area mL/min/1.81m  GFR Non African American 109  Comment: Non African American:  Normal GFR (glomerular filtration rate) > 60 mL/min/1.73 meters squared. < 60 may include impaired kidney function based on creatinine, age, legal sex, and race normalized to accepted average body surface area. mL/min/1.777m AGAP 13 7 - 16 mmol/L  Specimen Collected on  Blood 08/05/2018 4:24 AM   Hemoglobin A1c (08/04/2018 12:24 AM EDT) Only the most recent of 2 results within the time period is included.   Hemoglobin A1c (08/04/2018 12:24 AM EDT)  Component Value Ref Range Performed At Pathologist Signature  Hemoglobin A1c 7.7 (H) 4.8 - 5.6 % [place]    Blood Gas, Arterial on current oxygen level (08/03/2018 1:14 PM EDT) Blood Gas, Arterial on current oxygen level (08/03/2018 1:14 PM EDT)  Component Value Ref Range Performed At Pathologist Signature  PH ARTERIAL 7.127 (LL) 7.350 - 7.450 [facility] BLOOD GASES    PCO2 20.2 (LL) 35.0 - 45.0 mmHg [facility] BLOOD GASES    PO2 ARTERIAL 117.0 (H) 80.0 - 100.0 mmHg [facility] BLOOD GASES    HCO3 7 (LL) 21 - 28 mmol/L [facility] BLOOD GASES    BASE EXCESS -21 (L) -2 - 2 mmol/L [facility]    OXYGEN SATURATION MEASURED ART 97.7 % [place]  BLOOD GASES    PATIENT TEMPERATURE 98.6 Deg [place] BLOOD GASES    FRACTION OF INSPIRED OXYGEN 21 % [place] BLOOD GASES    VENTILATION MODE ARTERIAL RA   [place] BLOOD GASES    [place]ENS TEST yes   [place] BLOOD GASES    RESPIRATORY CARE NUMBER OF STICKS [place] 1   [place] BLOOD GASES    SITE RR   [place] BLOOD GASES     Lactic Acid (08/03/2018 11:30 AM EDT) Lactic Acid (08/03/2018 11:30 AM EDT)  Component Value Ref Range Performed At Pathologist Signature  Lactic Acid 1.0      Beta-Hydroxybutyric Acid (08/03/2018 9:55 AM EDT) Beta-Hydroxybutyric Acid (08/03/2018 9:55 AM EDT)  Component Value Ref Range Performed At Pathologist Signature  Beta-hydroxybutyrate >2.00 (H)

## 2018-08-11 ENCOUNTER — Telehealth: Payer: Self-pay

## 2018-08-11 DIAGNOSIS — E1121 Type 2 diabetes mellitus with diabetic nephropathy: Secondary | ICD-10-CM

## 2018-08-11 NOTE — Telephone Encounter (Signed)
Patient called requested a referral for Endocrinologist he prefers to see Dr. Amalia Greenhouse. Please advise. Areil Ottey,CMA

## 2018-08-11 NOTE — Telephone Encounter (Signed)
Referral placed.  Please let me know if you do not hear anything in the next week or so.

## 2018-08-11 NOTE — Telephone Encounter (Signed)
Left detailed message on patient vm advising that referral has been placed. Rhonda Cunningham,CMA

## 2018-08-17 ENCOUNTER — Telehealth: Payer: Self-pay

## 2018-08-17 NOTE — Telephone Encounter (Signed)
Patient called stated that Dr. Posey Pronto at [facility] office stated that they did not receive office visit notes. They are requesting notes be faxed to [phone]. I see under the referral it says notes were sent but they have not received them. Rhonda Cunningham,CMA

## 2018-08-17 NOTE — Telephone Encounter (Signed)
I refaxed the office notes and blood work to Dr. Posey Pronto - [facility]

## 2018-09-01 ENCOUNTER — Encounter: Payer: Self-pay | Admitting: Family Medicine

## 2018-09-01 LAB — CHLORIDE
CHLORIDE: 105
CO2: 18
Calcium: 8.2
HCO3, Arterial: 7 — AB (ref 18.0–23.0)
LACTIC ACID: 1
PH ART: 7.13 — AB (ref 7.35–7.45)
pCO2, Ven: 20 mm Hg — AB (ref 41–54)
pO2, Arterial: 117 — AB (ref 83–108)

## 2018-09-25 ENCOUNTER — Ambulatory Visit (INDEPENDENT_AMBULATORY_CARE_PROVIDER_SITE_OTHER): Payer: 59 | Admitting: Family Medicine

## 2018-09-25 ENCOUNTER — Encounter: Payer: Self-pay | Admitting: Family Medicine

## 2018-09-25 VITALS — BP 130/86 | HR 80 | Temp 98.2°F | Ht 70.0 in | Wt 208.6 lb

## 2018-09-25 DIAGNOSIS — K7581 Nonalcoholic steatohepatitis (NASH): Secondary | ICD-10-CM | POA: Diagnosis not present

## 2018-09-25 DIAGNOSIS — Z0000 Encounter for general adult medical examination without abnormal findings: Secondary | ICD-10-CM | POA: Diagnosis not present

## 2018-09-25 DIAGNOSIS — E1121 Type 2 diabetes mellitus with diabetic nephropathy: Secondary | ICD-10-CM

## 2018-09-25 DIAGNOSIS — Z6829 Body mass index (BMI) 29.0-29.9, adult: Secondary | ICD-10-CM

## 2018-09-25 DIAGNOSIS — I10 Essential (primary) hypertension: Secondary | ICD-10-CM

## 2018-09-25 DIAGNOSIS — Z125 Encounter for screening for malignant neoplasm of prostate: Secondary | ICD-10-CM

## 2018-09-25 MED ORDER — BENZONATATE 200 MG PO CAPS: 200.0000 mg | ORAL_CAPSULE | Freq: Three times a day (TID) | ORAL | 0 refills | Status: DC | PRN

## 2018-09-25 NOTE — Progress Notes (Signed)
JET ARMBRUST is a 57 y.o. male who presents to [place]: [place] today for well adult visit and new cough.   Mr. Vegh is doing well overall. His diabetes remains controlled on saxigliptan-Metformin. He checks his blood sugar in the morning, and it ranges from 130s-160s. He denies symptoms of low blood sugar, including sweating and dizziness. He will have his eye exam in a few weeks. He denies numbness or tingling in his feet.  Mr. Demetriou had a cold around one month ago. His symptoms have resolved except for a dry cough that has persisted for a little over a month. He denies fevers, chills, productive cough, and runny nose. He has tried cough drops without relief.   He exercises regularly and feels happy with how things are going in his life.  ROS as above:  Past Medical History:  Diagnosis Date  . Diabetes [facility])    Past Surgical History:  Procedure Laterality Date  . rotator cuff surgeries  [id number]   Social History   Tobacco Use  . Smoking status: Never Smoker  . Smokeless tobacco: Never Used  Substance Use Topics  . Alcohol use: Yes   Mr. Hallas plays golf 3-4 times/week and walks ~1 hour/day. He tries to avoid simple carbs and eats vegetables every day.   family history includes Prostate cancer in his father.  Medications: Current Outpatient Medications  Medication Sig Dispense Refill  . AMBULATORY NON FORMULARY MEDICATION lancets, test strips of choice for testing 2x daily E11.29 Uncontrolled diabetes 1 each 0  . lisinopril (PRINIVIL,ZESTRIL) 2.5 MG tablet Take 1 tablet (2.5 mg total) by mouth daily. 90 tablet 3  . Saxagliptin-Metformin (KOMBIGLYZE XR) 2.03-999 MG TB24 TAKE 1 TABLET BY MOUTH 2 (TWO) TIMES DAILY. 90 tablet 3  . benzonatate (TESSALON) 200 MG capsule Take 1 capsule (200 mg total) by mouth 3 (three) times daily as needed for cough. 45 capsule  0   No current facility-administered medications for this visit.    Allergies  Allergen Reactions  . Canagliflozin Other (See Comments)    DKA    Health Maintenance Health Maintenance  Topic Date Due  . OPHTHALMOLOGY EXAM  04/11/2018  . HEMOGLOBIN A1C  02/03/2019  . FOOT EXAM  09/26/2019  . COLONOSCOPY  07/24/2020  . TETANUS/TDAP  07/09/2024  . INFLUENZA VACCINE  Completed  . PNEUMOCOCCAL POLYSACCHARIDE VACCINE AGE 1-64 HIGH RISK  Completed  . Hepatitis C Screening  Completed  . HIV Screening  Completed     Exam:  BP 130/86   Pulse 80   Temp 98.2 F (36.8 C) (Oral)   Ht 5' 10"  (1.778 m)   Wt 208 lb 9.6 oz (94.6 kg)   BMI 29.93 kg/m  Wt Readings from Last 5 Encounters:  09/25/18 208 lb 9.6 oz (94.6 kg)  08/08/18 207 lb (93.9 kg)  07/17/18 217 lb (98.4 kg)  09/20/17 209 lb (94.8 kg)  06/08/17 210 lb (95.3 kg)      Gen: Well NAD HEENT: EOMI,  MMM Lungs: Normal work of breathing. CTABL Heart: RRR no MRG Abd: NABS, Soft. Nondistended, Nontender Exts: Brisk capillary refill, warm and well perfused.  Psych: Normal affect  Depression screen [facility] 2/9 09/25/2018 09/20/2017 10/08/2016  Decreased Interest 0 0 0  Down, Depressed, Hopeless 0 0 0  PHQ - 2 Score 0 0 0       Lab and Radiology Results HbA1C 08/05/18: 7.7  Lipid panel 08/01/18 LDL:  84 Triglycerides: 92 HDL: 53 Cholesterol: 156   Assessment and Plan: 56 y.o. male with   Diabetes: Mr. Breeden' diabetes remains control on saxigliptin-metformin. His diabetic food exam was normal. He has an appointment with his eye doctor in a few weeks. Continue monitoring blood sugars daily. Continue on saxigliptin-metformin. Recommended 60g or fewer carbs per meal. Counseled pt on staying physically active. This will also help BMI 29.   Persistent dry cough: His dry cough has persisted past the resolution of his cold. He has been on lisinopril for years, so this is less likely the cause of his dry cough. Prescribed  tessalon pearls as needed for cough. Advised pt to follow-up if worsening.   Cholesterol: Mr. Hargrove' lipid panel is in the normal range, but would be ideal to get get LDL even lower. Discussed starting pravastatin based on his ASCVD score of 12.2% risk of cardiovascular event in next 10 years. Patient would like to discuss this with his wife before making final decision.  Get labs in one month. Follow-up in four months.   Orders Placed This Encounter  Procedures  . PSA  . COMPLETE METABOLIC PANEL WITH GFR  . Hemoglobin A1c   Meds ordered this encounter  Medications  . benzonatate (TESSALON) 200 MG capsule    Sig: Take 1 capsule (200 mg total) by mouth 3 (three) times daily as needed for cough.    Dispense:  45 capsule    Refill:  0     Discussed warning signs or symptoms. Please see discharge instructions. Patient expresses understanding.  I personally was present and performed or re-performed the history, physical exam and medical decision-making activities of this service and have verified that the service and findings are accurately documented in the student's note. ___________________________________________ Lynne Leader M.D., ABFM., CAQSM. Primary Care and Sports Medicine Adjunct Instructor of [employer]

## 2018-09-25 NOTE — Patient Instructions (Addendum)
Thank you for coming in today. Try to shoot for 60g or fewer carbs per meal.  Try to stay active.  Take tessalon pearles as needed for cough.   Research pravastatin.   Get labs in about 1 month.  Recheck with me in 4 months.   Return sooner if needed.    Pravastatin tablets What is this medicine? PRAVASTATIN (PRA va stat in) is known as a HMG-CoA reductase inhibitor or 'statin'. It lowers the level of cholesterol and triglycerides in the blood. This drug may also reduce the risk of heart attack, stroke, or other health problems in patients with risk factors for heart disease. Diet and lifestyle changes are often used with this drug. This medicine may be used for other purposes; ask your health care provider or pharmacist if you have questions. COMMON BRAND NAME(S): Pravachol What should I tell my health care provider before I take this medicine? They need to know if you have any of these conditions: -frequently drink alcoholic beverages -kidney disease -liver disease -muscle aches or weakness -other medical condition -an unusual or allergic reaction to pravastatin, other medicines, foods, dyes, or preservatives -pregnant or trying to get pregnant -breast-feeding How should I use this medicine? Take pravastatin tablets by mouth. Swallow the tablets with a drink of water. Pravastatin can be taken at anytime of the day, with or without food. Follow the directions on the prescription label. Take your doses at regular intervals. Do not take your medicine more often than directed. Talk to your pediatrician regarding the use of this medicine in children. Special care may be needed. Pravastatin has been used in children as young as 28 years of age. Overdosage: If you think you have taken too much of this medicine contact a poison control center or emergency room at once. NOTE: This medicine is only for you. Do not share this medicine with others. What if I miss a dose? If you miss a dose,  take it as soon as you can. If it is almost time for your next dose, take only that dose. Do not take double or extra doses. What may interact with this medicine? This medicine may interact with the following medications: -colchicine -cyclosporine -other medicines for high cholesterol -some antibiotics like azithromycin, clarithromycin, erythromycin, and telithromycin This list may not describe all possible interactions. Give your health care provider a list of all the medicines, herbs, non-prescription drugs, or dietary supplements you use. Also tell them if you smoke, drink alcohol, or use illegal drugs. Some items may interact with your medicine. What should I watch for while using this medicine? Visit your doctor or health care professional for regular check-ups. You may need regular tests to make sure your liver is working properly. Tell your doctor or health care professional right away if you get any unexplained muscle pain, tenderness, or weakness, especially if you also have a fever and tiredness. Your doctor or health care professional may tell you to stop taking this medicine if you develop muscle problems. If your muscle problems do not go away after stopping this medicine, contact your health care professional. This drug is only part of a total heart-health program. Your doctor or a dietician can suggest a low-cholesterol and low-fat diet to help. Avoid alcohol and smoking, and keep a proper exercise schedule. Do not use this drug if you are pregnant or breast-feeding. Serious side effects to an unborn child or to an infant are possible. Talk to your doctor or pharmacist for more information.  This medicine may affect blood sugar levels. If you have diabetes, check with your doctor or health care professional before you change your diet or the dose of your diabetic medicine. If you are going to have surgery tell your health care professional that you are taking this drug. What side effects  may I notice from receiving this medicine? Side effects that you should report to your doctor or health care professional as soon as possible: -allergic reactions like skin rash, itching or hives, swelling of the face, lips, or tongue -dark urine -fever -muscle pain, cramps, or weakness -redness, blistering, peeling or loosening of the skin, including inside the mouth -trouble passing urine or change in the amount of urine -unusually weak or tired -yellowing of the eyes or skin Side effects that usually do not require medical attention (report to your doctor or health care professional if they continue or are bothersome): -gas -headache -heartburn -indigestion -stomach pain This list may not describe all possible side effects. Call your doctor for medical advice about side effects. You may report side effects to FDA at 1-800-FDA-1088. Where should I keep my medicine? Keep out of the reach of children. Store at room temperature between 15 to 30 degrees C (59 to 86 degrees F). Protect from light. Keep container tightly closed. Throw away any unused medicine after the expiration date. NOTE: This sheet is a summary. It may not cover all possible information. If you have questions about this medicine, talk to your doctor, pharmacist, or health care provider.  2018 Elsevier/Gold Standard (2015-06-05 16:00:27)

## 2018-10-10 IMAGING — US US ABDOMEN COMPLETE
1 series · 13 of 25 positions shown · non-contrast
Comparison: No prior.

CLINICAL DATA: History of abnormal CT.

EXAM:
ABDOMEN ULTRASOUND COMPLETE

[Series 1: us abdomen complete · 0.31mm/px · 13 of 80 slices shown]
[im 1/80]
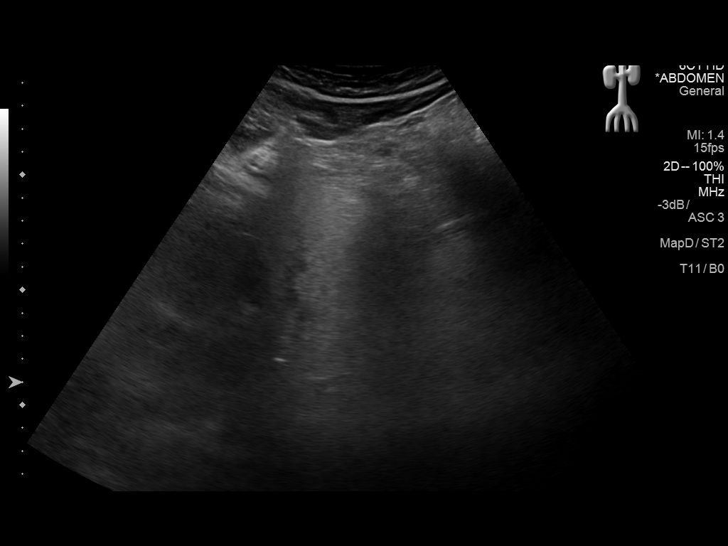
[im 7/80]
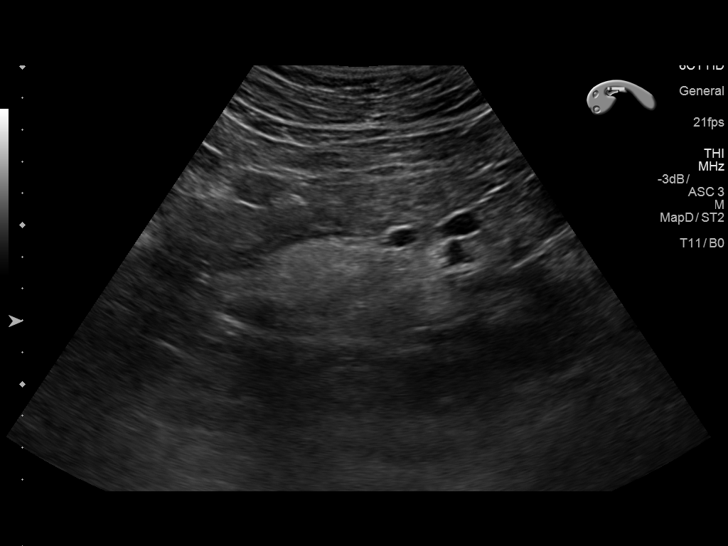
[im 14/80]
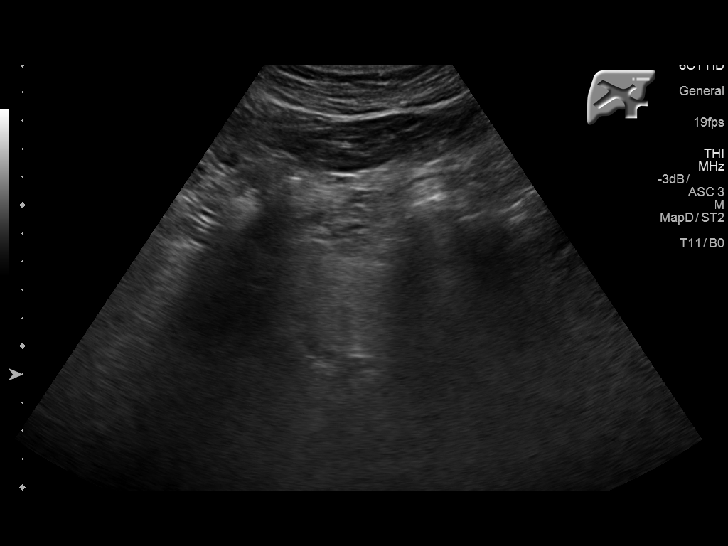
[im 20/80]
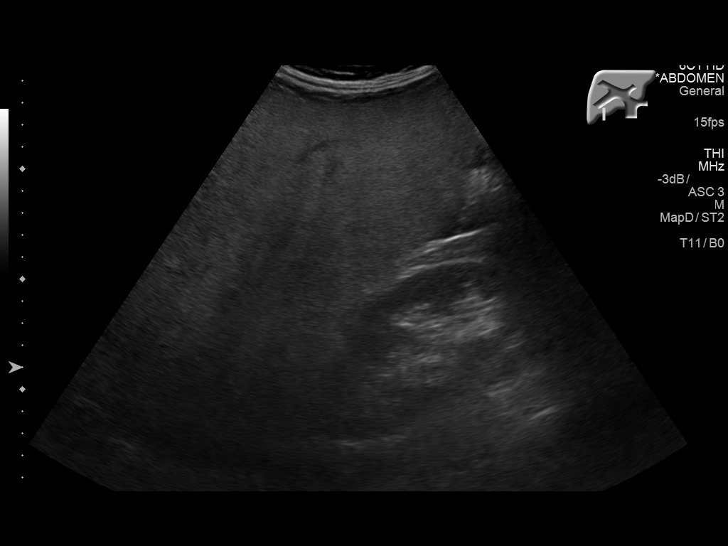
[im 27/80]
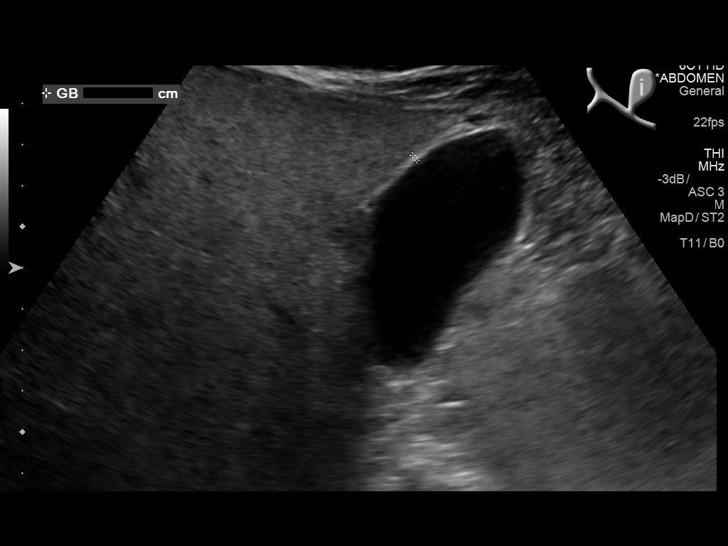
[im 33/80]
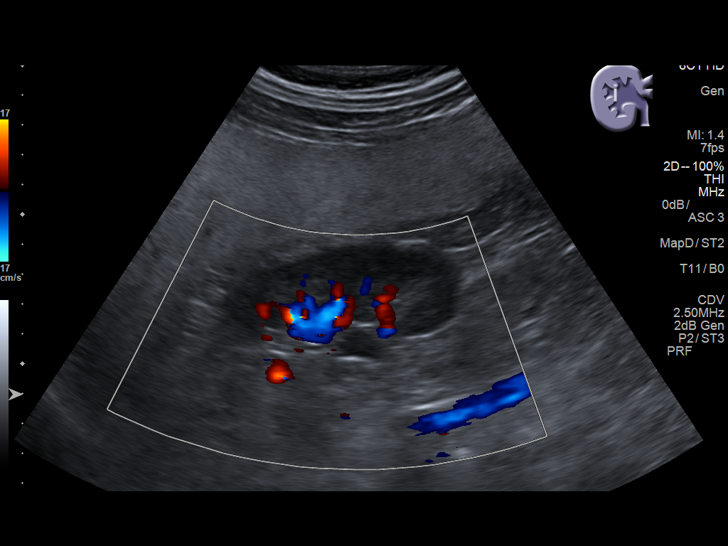
[im 40/80]
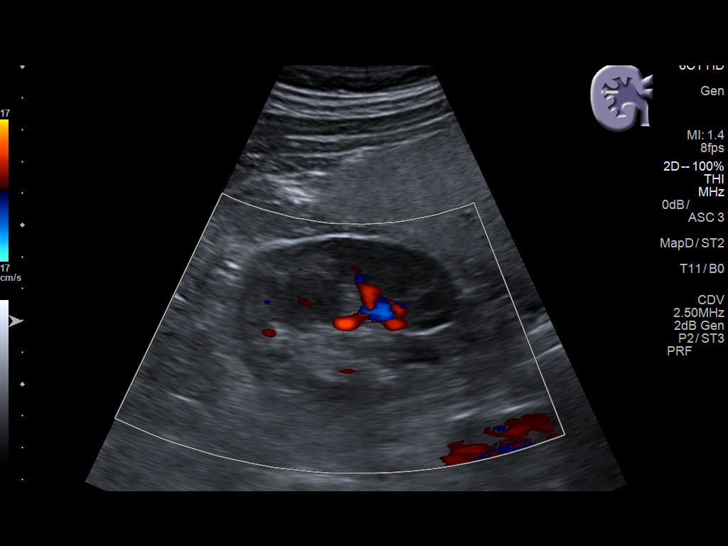
[im 47/80]
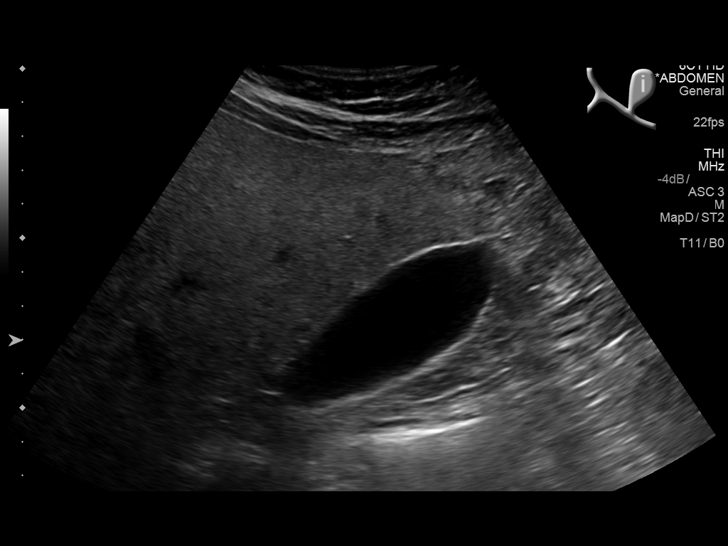
[im 53/80]
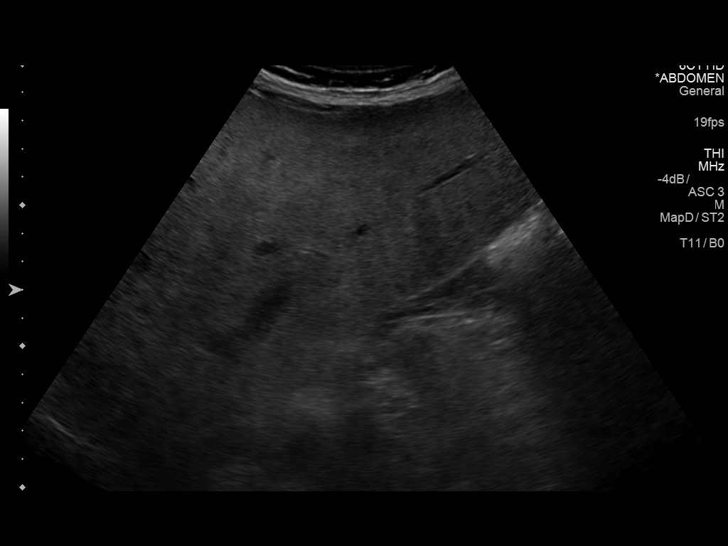
[im 60/80]
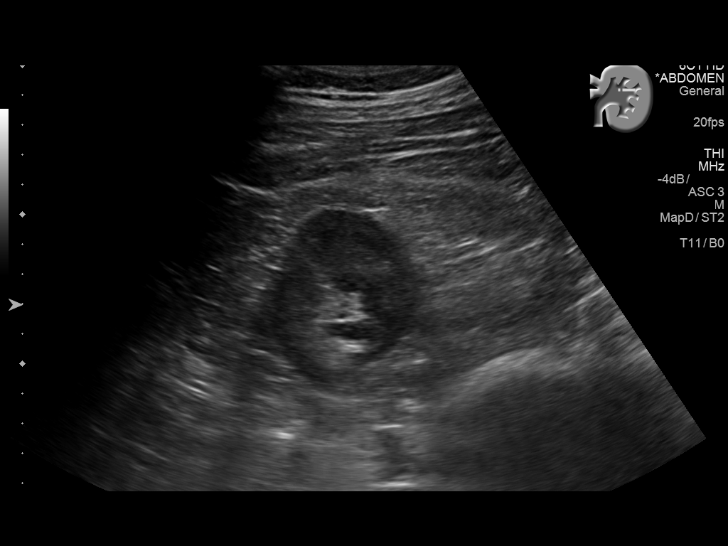
[im 66/80]
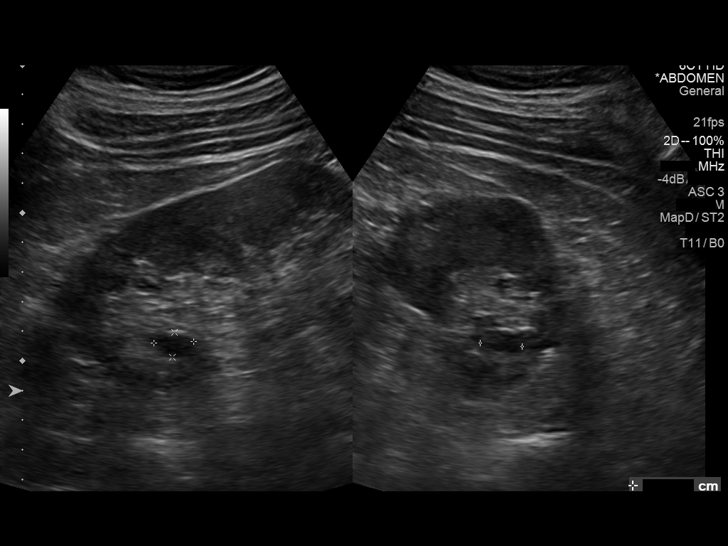
[im 73/80]
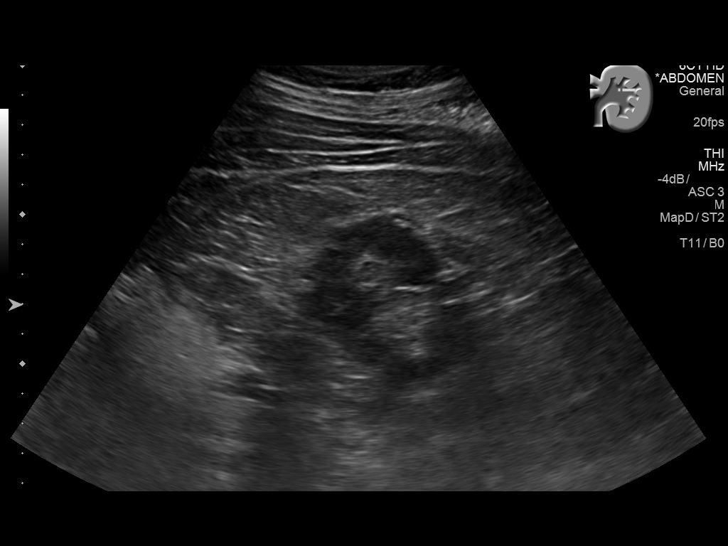
[im 80/80]
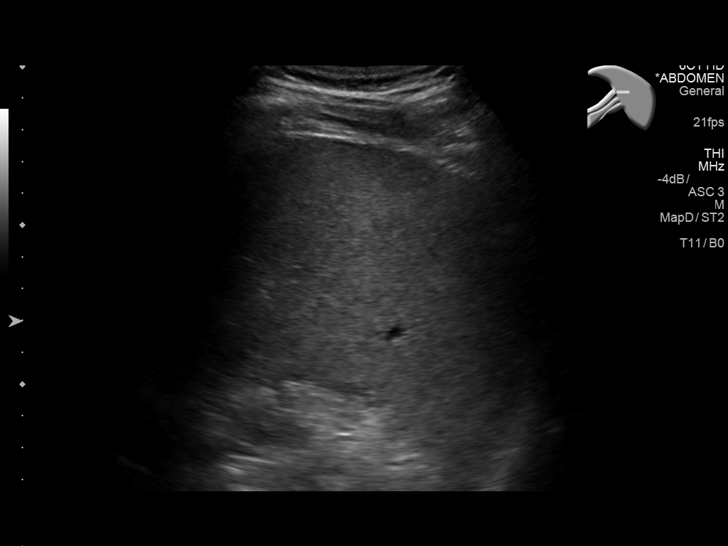

[13 of 25 positions shown; findings below may reference images not displayed]

FINDINGS: Gallbladder: No gallstones or wall thickening visualized. No
sonographic Murphy sign noted by sonographer.

Common bile duct: Diameter: 4 mm

Liver: The liver is echogenic consistent with fatty infiltration
and/or hepatocellular disease. The liver appears prominent.

IVC: No abnormality visualized.

Pancreas: Limited visualization.

Spleen: Size and appearance within normal limits.

Right Kidney: Length: 11.2 cm. Echogenicity within normal limits. No
hydronephrosis visualized. 1.1 cm simple cyst right kidney.

Left Kidney: Length: 12.3 cm. Echogenicity within normal limits. No
mass or hydronephrosis visualized. 1.4 cm complex cyst upper pole
left kidney. 1 cm simple cyst midportion left kidney.

Abdominal aorta: No aneurysm visualized.

Other findings: Limited exam due to overlying bowel gas.
IMPRESSION: 1 the liver is echogenic consistent with fatty infiltration and/or
hepatocellular disease. Liver appears prominent. No focal hepatic
abnormality identified. No gallstones or biliary distention.

2. 1.4 cm complex cyst upper pole left kidney. Reference is made to
prior CT. If further evaluation is needed MRI can be obtained .

## 2018-10-12 LAB — HM DIABETES EYE EXAM

## 2018-10-17 ENCOUNTER — Telehealth: Payer: Self-pay | Admitting: Family Medicine

## 2018-10-17 NOTE — Telephone Encounter (Signed)
Received diabetic eye exam.  Date of service 10/12/2018.  No retinopathy.

## 2018-11-23 ENCOUNTER — Encounter: Payer: Self-pay | Admitting: Family Medicine

## 2019-01-14 ENCOUNTER — Other Ambulatory Visit: Payer: Self-pay | Admitting: Family Medicine

## 2019-01-24 ENCOUNTER — Ambulatory Visit: Payer: 59 | Admitting: Family Medicine

## 2019-09-12 ENCOUNTER — Telehealth: Payer: Self-pay | Admitting: Family Medicine

## 2019-09-12 ENCOUNTER — Other Ambulatory Visit: Payer: Self-pay

## 2019-09-12 DIAGNOSIS — Z125 Encounter for screening for malignant neoplasm of prostate: Secondary | ICD-10-CM

## 2019-09-12 DIAGNOSIS — Z0000 Encounter for general adult medical examination without abnormal findings: Secondary | ICD-10-CM

## 2019-09-12 DIAGNOSIS — K7581 Nonalcoholic steatohepatitis (NASH): Secondary | ICD-10-CM

## 2019-09-12 DIAGNOSIS — E1121 Type 2 diabetes mellitus with diabetic nephropathy: Secondary | ICD-10-CM

## 2019-09-12 DIAGNOSIS — I10 Essential (primary) hypertension: Secondary | ICD-10-CM

## 2019-09-12 NOTE — Progress Notes (Signed)
Pt requested lab order. Annual labs ordered.

## 2019-09-12 NOTE — Telephone Encounter (Signed)
Task completed. Annual labs has been ordered for pt. Pls contact and update the pt. Thanks.

## 2019-09-12 NOTE — Telephone Encounter (Signed)
Patient would like labs put in before his physical

## 2019-09-26 ENCOUNTER — Encounter: Payer: 59 | Admitting: Osteopathic Medicine

## 2019-10-11 ENCOUNTER — Encounter: Payer: 59 | Admitting: Osteopathic Medicine

## 2019-12-19 ENCOUNTER — Encounter: Payer: Self-pay | Admitting: Osteopathic Medicine

## 2019-12-19 ENCOUNTER — Ambulatory Visit (INDEPENDENT_AMBULATORY_CARE_PROVIDER_SITE_OTHER): Payer: 59 | Admitting: Osteopathic Medicine

## 2019-12-19 ENCOUNTER — Other Ambulatory Visit: Payer: Self-pay

## 2019-12-19 VITALS — BP 134/82 | HR 80 | Temp 98.3°F | Wt 215.0 lb

## 2019-12-19 DIAGNOSIS — Z23 Encounter for immunization: Secondary | ICD-10-CM

## 2019-12-19 DIAGNOSIS — Z0000 Encounter for general adult medical examination without abnormal findings: Secondary | ICD-10-CM | POA: Diagnosis not present

## 2019-12-19 DIAGNOSIS — G4700 Insomnia, unspecified: Secondary | ICD-10-CM | POA: Diagnosis not present

## 2019-12-19 MED ORDER — BELSOMRA 15 MG PO TABS: 15.0000 mg | ORAL_TABLET | Freq: Every day | ORAL | 0 refills | Status: DC

## 2019-12-19 MED ORDER — BELSOMRA 10 MG PO TABS: 1.0000 | ORAL_TABLET | Freq: Every day | ORAL | 0 refills | Status: DC

## 2019-12-19 MED ORDER — BELSOMRA 20 MG PO TABS: 20.0000 mg | ORAL_TABLET | Freq: Every day | ORAL | 0 refills | Status: DC

## 2019-12-19 NOTE — Progress Notes (Signed)
HPI: Cristian Watkins is a 59 y.o. male who  has a past medical history of Diabetes ([place]).  he presents to [facility] today, 12/19/19,  for chief complaint of: Annual physical, insomnia  Pleasant patient here to reestablish primary care, previous PCP has left the practice.  He has had some issues with insomnia, but otherwise feels pretty well overall.  Would like to maybe try something, is nervous to try Ambien/Lunesta.  Has tried melatonin, trazodone in the past.  Snores, but no apnea.  No daytime somnolence.  Wakes up every couple of hours through the night, is usually able to get back to sleep okay  Past medical, surgical, social and family history reviewed:  Patient Active Problem List   Diagnosis Date Noted  . HTN (hypertension) 07/17/2018  . Steatohepatitis 12/15/2016  . Kidney cyst, acquired 12/07/2016  . Partial tear of right subscapularis tendon 09/15/2016  . Glenohumeral arthritis, right 09/15/2016  . Elevated C-reactive protein (CRP) 08/13/2016  . Lateral epicondylitis of both elbows 01/02/2016  . Patellofemoral arthralgia of both knees 10/31/2015  . Family history of prostate cancer 08/24/2013  . Insomnia 08/24/2013  . 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 History of sleep apnea 08/24/2013    Past Surgical History:  Procedure Laterality Date  . rotator cuff surgeries  [id number]    Social History   Tobacco Use  . Smoking status: Never Smoker  . Smokeless tobacco: Never Used  Substance Use Topics  . Alcohol use: Yes    Family History  Problem Relation Age of Onset  . Prostate cancer Father      Current medication list and allergy/intolerance information reviewed:    Current Outpatient Medications  Medication Sig Dispense Refill  . AMBULATORY NON FORMULARY MEDICATION lancets, test strips of choice for testing 2x daily E11.29 Uncontrolled diabetes 1 each 0  . metFORMIN (GLUCOPHAGE-XR) 500 MG 24  hr tablet Take 1,000 mg by mouth 2 (two) times daily.    . RYBELSUS 7 MG TABS Take 1 tablet by mouth daily.    . Suvorexant (BELSOMRA) 10 MG TABS Take 1 tablet by mouth at bedtime. 10 tablet 0  . Suvorexant (BELSOMRA) 15 MG TABS Take 15 mg by mouth at bedtime. 10 tablet 0  . Suvorexant (BELSOMRA) 20 MG TABS Take 20 mg by mouth at bedtime. 10 tablet 0   No current facility-administered medications for this visit.    Allergies  Allergen Reactions  . Canagliflozin Other (See Comments)    DKA       Review of Systems:  Constitutional:  No  fever, no chills, No recent illness, No unintentional weight changes. No significant fatigue.   HEENT: No  headache, no vision change, no hearing change, No sore throat, No  sinus pressure  Cardiac: No  chest pain, No  pressure, No palpitations, No  Orthopnea  Respiratory:  No  shortness of breath. No  Cough  Gastrointestinal: No  abdominal pain, No  nausea, No  vomiting,  No  blood in stool, No  diarrhea, No  constipation   Musculoskeletal: No new myalgia/arthralgia  Skin: No  Rash, No other wounds/concerning lesions  Neurologic: No  weakness, No  dizziness, No  slurred speech/focal weakness/facial droop  Psychiatric: No  concerns with depression, No  concerns with anxiety, +sleep problems, No mood problems  Exam:  BP 134/82 (BP Location: Left Arm, Patient Position: Sitting, Cuff Size: Normal)   Pulse 80   Temp 98.3 F (  36.8 C) (Oral)   Wt 215 lb 0.6 oz (97.5 kg)   BMI 30.86 kg/m   Constitutional: VS see above. General Appearance: alert, well-developed, well-nourished, NAD  Eyes: Normal lids and conjunctive, non-icteric sclera  Neck: No masses, trachea midline. No thyroid enlargement. No tenderness/mass appreciated. No lymphadenopathy  Respiratory: Normal respiratory effort. no wheeze, no rhonchi, no rales  Cardiovascular: S1/S2 normal, no murmur, no rub/gallop auscultated. RRR. No lower extremity edema. Pedal pulse II/IV  bilaterally DP and PT. No carotid bruit or JVD. No abdominal aortic bruit.  Gastrointestinal: Nontender, no masses. No hepatomegaly, no splenomegaly. No hernia appreciated. Bowel sounds normal. Rectal exam deferred.   Musculoskeletal: Gait normal. No clubbing/cyanosis of digits.   Neurological: Normal balance/coordination. No tremor. No cranial nerve deficit on limited exam. Motor and sensation intact and symmetric. Cerebellar reflexes intact.   Skin: warm, dry, intact. No rash/ulcer. No concerning nevi or subq nodules on limited exam.    Psychiatric: Normal judgment/insight. Normal mood and affect. Oriented x3.    No results found for this or any previous visit (from the past 72 hour(s)).  No results found.   ASSESSMENT/PLAN: The primary encounter diagnosis was Annual physical exam. Diagnoses of Insomnia, unspecified type and Need for shingles vaccine were also pertinent to this visit.   Orders Placed This Encounter  Procedures  . Varicella-zoster vaccine IM (Shingrix)    Meds ordered this encounter  Medications  . Suvorexant (BELSOMRA) 10 MG TABS    Sig: Take 1 tablet by mouth at bedtime.    Dispense:  10 tablet    Refill:  0  . Suvorexant (BELSOMRA) 15 MG TABS    Sig: Take 15 mg by mouth at bedtime.    Dispense:  10 tablet    Refill:  0  . Suvorexant (BELSOMRA) 20 MG TABS    Sig: Take 20 mg by mouth at bedtime.    Dispense:  10 tablet    Refill:  0    Patient Instructions  Follow up:  2-3 weeks check insomnia (virtual)  2-6 mos Shingrix shot #2 (nurse visit)   1 year annual wellness check-up  General Preventive Care  Most recent routine screening lipids/other labs: ordered today!  Everyone should have blood pressure checked once per year.   Tobacco: don't!   Alcohol: responsible moderation is ok for most adults - if you have concerns about your alcohol intake, please talk to me!   Exercise: as tolerated to reduce risk of cardiovascular disease and  diabetes. Strength training will also prevent osteoporosis.   Mental health: if need for mental health care (medicines, counseling, other), or concerns about moods, please let me know!   Sexual health: if need for STD testing, or if concerns with libido/pain problems, please let me know!   Advanced Directive: Living Will and/or Healthcare Power of Attorney recommended for all adults, regardless of age or health.  Vaccines  Flu vaccine: recommended for almost everyone, every fall.   Shingles vaccine: Shingrix recommended after age 73.   Pneumonia vaccines: booster recommended after age 12  Tetanus booster: Tdap recommended every 10 years. Due 2025 Cancer screenings   Colon cancer screening: due 07/2020  Prostate cancer screening: PSA blood test age 50-71 annually   Lung cancer screening: not needed for non-smokers  Infection screenings . HIV: recommended screening at least once age 20-65, more often as needed. . Gonorrhea/Chlamydia: screening as needed . Hepatitis C: recommended for anyone born 64-1966 . TB: certain at-risk populations, or depending on work  requirements and/or travel history Other . Bone Density Test: recommended for men at age 67 . Abdominal Aortic Aneurysm: screening with ultrasound recommended once for men age 68-75 who have ever smoked    Please be aware that standard of care for diabetic patients includes the following:  Metformin to help control sugars and slow/prevent progression of diabetes from bad to worse, even if sugars are at goal (A1C 6.5-7.0%).  Blood pressure medicine Lisinopril or similar to help protect kidneys long-term, even if blood pressure is at goal (<130/80), OR monitor for protein in the urine at least once per year.   Statin medications to prevent against heart attack/stroke, even if cholesterol numbers are at goal (LDL/bad cholesterol <70).   If you have questions about medications, please speak with your endocrinologist!    Standard of care for diabetics also includes having the pneumonia vaccine once before age 89, obtaining annual eye exam, frequently self-examining the feet and having a medical foot exam at least once per year.         Visit summary with medication list and pertinent instructions was printed for patient to review. All questions at time of visit were answered - patient instructed to contact office with any additional concerns or updates. ER/RTC precautions were reviewed with the patient.     Please note: voice recognition software was used to produce this document, and typos may escape review. Please contact Dr. Sheppard Coil for any needed clarifications.     Follow-up plan: see pt instructions above

## 2019-12-19 NOTE — Patient Instructions (Addendum)
Follow up:  2-3 weeks check insomnia (virtual)  2-6 mos Shingrix shot #2 (nurse visit)   1 year annual wellness check-up  General Preventive Care  Most recent routine screening lipids/other labs: ordered today!  Everyone should have blood pressure checked once per year.   Tobacco: don't!   Alcohol: responsible moderation is ok for most adults - if you have concerns about your alcohol intake, please talk to me!   Exercise: as tolerated to reduce risk of cardiovascular disease and diabetes. Strength training will also prevent osteoporosis.   Mental health: if need for mental health care (medicines, counseling, other), or concerns about moods, please let me know!   Sexual health: if need for STD testing, or if concerns with libido/pain problems, please let me know!   Advanced Directive: Living Will and/or Healthcare Power of Attorney recommended for all adults, regardless of age or health.  Vaccines  Flu vaccine: recommended for almost everyone, every fall.   Shingles vaccine: Shingrix recommended after age 27.   Pneumonia vaccines: booster recommended after age 96  Tetanus booster: Tdap recommended every 10 years. Due 2025 Cancer screenings   Colon cancer screening: due 07/2020  Prostate cancer screening: PSA blood test age 74-71 annually   Lung cancer screening: not needed for non-smokers  Infection screenings . HIV: recommended screening at least once age 25-65, more often as needed. . Gonorrhea/Chlamydia: screening as needed . Hepatitis C: recommended for anyone born 43-1966 . TB: certain at-risk populations, or depending on work requirements and/or travel history Other . Bone Density Test: recommended for men at age 63 . Abdominal Aortic Aneurysm: screening with ultrasound recommended once for men age 45-75 who have ever smoked    Please be aware that standard of care for diabetic patients includes the following:  Metformin to help control sugars and  slow/prevent progression of diabetes from bad to worse, even if sugars are at goal (A1C 6.5-7.0%).  Blood pressure medicine Lisinopril or similar to help protect kidneys long-term, even if blood pressure is at goal (<130/80), OR monitor for protein in the urine at least once per year.   Statin medications to prevent against heart attack/stroke, even if cholesterol numbers are at goal (LDL/bad cholesterol <70).   If you have questions about medications, please speak with your endocrinologist!   Standard of care for diabetics also includes having the pneumonia vaccine once before age 24, obtaining annual eye exam, frequently self-examining the feet and having a medical foot exam at least once per year.

## 2019-12-21 ENCOUNTER — Telehealth: Payer: Self-pay | Admitting: Osteopathic Medicine

## 2019-12-21 NOTE — Telephone Encounter (Signed)
Approved today Request Reference Number: [id number]. BELSOMRA TAB 10MG is approved through 12/20/2020. Your patient may now fill this prescription and it will be covered. Pharmacy aware.

## 2019-12-21 NOTE — Telephone Encounter (Signed)
Received fax from [employer] that [place] requires a PA. Information has been sent to the insurance company. Awaiting determination.

## 2019-12-25 LAB — COMPLETE METABOLIC PANEL WITH GFR
AG Ratio: 1.9 (calc) (ref 1.0–2.5)
ALT: 62 U/L — ABNORMAL HIGH (ref 9–46)
AST: 28 U/L (ref 10–35)
Albumin: 4.5 g/dL (ref 3.6–5.1)
Alkaline phosphatase (APISO): 51 U/L (ref 35–144)
BUN: 10 mg/dL (ref 7–25)
CO2: 26 mmol/L (ref 20–32)
Calcium: 9.3 mg/dL (ref 8.6–10.3)
Chloride: 102 mmol/L (ref 98–110)
Creat: 0.76 mg/dL (ref 0.70–1.33)
GFR, Est African American: 116 mL/min/{1.73_m2} (ref >=60)
GFR, Est Non African American: 100 mL/min/{1.73_m2} (ref >=60)
Globulin: 2.4 g/dL (calc) (ref 1.9–3.7)
Glucose, Bld: 169 mg/dL — ABNORMAL HIGH (ref 65–99)
Potassium: 4.2 mmol/L (ref 3.5–5.3)
Sodium: 138 mmol/L (ref 135–146)
Total Bilirubin: 0.6 mg/dL (ref 0.2–1.2)
Total Protein: 6.9 g/dL (ref 6.1–8.1)

## 2019-12-25 LAB — MICROALBUMIN / CREATININE URINE RATIO
Creatinine, Urine: 175 mg/dL (ref 20–320)
Microalb Creat Ratio: 53 mcg/mg creat — ABNORMAL HIGH (ref <30)
Microalb, Ur: 9.3 mg/dL

## 2019-12-25 LAB — LIPID PANEL
Cholesterol: 140 mg/dL (ref <200)
HDL: 46 mg/dL (ref >=40)
LDL Cholesterol (Calc): 79 mg/dL (calc)
Non-HDL Cholesterol (Calc): 94 mg/dL (calc) (ref <130)
Total CHOL/HDL Ratio: 3 (calc) (ref <5.0)
Triglycerides: 69 mg/dL (ref <150)

## 2019-12-25 LAB — CBC
HCT: 45.9 % (ref 38.5–50.0)
Hemoglobin: 15.5 g/dL (ref 13.2–17.1)
MCH: 28 pg (ref 27.0–33.0)
MCHC: 33.8 g/dL (ref 32.0–36.0)
MCV: 82.9 fL (ref 80.0–100.0)
MPV: 10.6 fL (ref 7.5–12.5)
Platelets: 211 10*3/uL (ref 140–400)
RBC: 5.54 10*6/uL (ref 4.20–5.80)
RDW: 13 % (ref 11.0–15.0)
WBC: 4.7 10*3/uL (ref 3.8–10.8)

## 2019-12-25 LAB — HEMOGLOBIN A1C
Hgb A1c MFr Bld: 8.5 % of total Hgb — ABNORMAL HIGH (ref <5.7)
Mean Plasma Glucose: 197 (calc)
eAG (mmol/L): 10.9 (calc)

## 2019-12-25 LAB — PSA: PSA: 2.7 ng/mL (ref <=4.0)

## 2020-02-18 ENCOUNTER — Ambulatory Visit (INDEPENDENT_AMBULATORY_CARE_PROVIDER_SITE_OTHER): Payer: 59 | Admitting: Family Medicine

## 2020-02-18 ENCOUNTER — Other Ambulatory Visit: Payer: Self-pay

## 2020-02-18 VITALS — BP 132/77 | HR 87 | Ht 70.0 in | Wt 215.0 lb

## 2020-02-18 DIAGNOSIS — Z23 Encounter for immunization: Secondary | ICD-10-CM | POA: Diagnosis not present

## 2020-02-18 NOTE — Progress Notes (Signed)
Medical screening examination/treatment was performed by qualified clinical staff member and as supervising physician I was immediately available for consultation/collaboration. I have reviewed documentation and agree with assessment and plan.  Mohid Furuya, DO  

## 2020-02-18 NOTE — Progress Notes (Signed)
Patient is here for his second shingles vaccine. He states he did have some fatigue and arm discomfort after last injection, but otherwise no reaction. Shingles injection to right deltoid with no apparent complications. Patient advised to call with any issues.

## 2020-04-18 ENCOUNTER — Ambulatory Visit (INDEPENDENT_AMBULATORY_CARE_PROVIDER_SITE_OTHER): Payer: 59

## 2020-04-18 ENCOUNTER — Ambulatory Visit (INDEPENDENT_AMBULATORY_CARE_PROVIDER_SITE_OTHER): Payer: 59 | Admitting: Osteopathic Medicine

## 2020-04-18 ENCOUNTER — Encounter: Payer: Self-pay | Admitting: Osteopathic Medicine

## 2020-04-18 ENCOUNTER — Other Ambulatory Visit: Payer: Self-pay

## 2020-04-18 VITALS — BP 141/87 | HR 92 | Temp 98.0°F | Wt 210.0 lb

## 2020-04-18 DIAGNOSIS — E1121 Type 2 diabetes mellitus with diabetic nephropathy: Secondary | ICD-10-CM | POA: Diagnosis not present

## 2020-04-18 DIAGNOSIS — R05 Cough: Secondary | ICD-10-CM

## 2020-04-18 DIAGNOSIS — R058 Other specified cough: Secondary | ICD-10-CM

## 2020-04-18 LAB — POCT GLYCOSYLATED HEMOGLOBIN (HGB A1C): Hemoglobin A1C: 11.4 % — AB (ref 4.0–5.6)

## 2020-04-18 MED ORDER — ALBUTEROL SULFATE HFA 108 (90 BASE) MCG/ACT IN AERS: 2.0000 | INHALATION_SPRAY | Freq: Four times a day (QID) | RESPIRATORY_TRACT | 1 refills | Status: DC | PRN

## 2020-04-18 MED ORDER — OMEPRAZOLE 40 MG PO CPDR: 40.0000 mg | DELAYED_RELEASE_CAPSULE | Freq: Every day | ORAL | 0 refills | Status: DC

## 2020-04-18 MED ORDER — RYBELSUS 7 MG PO TABS: 1.0000 | ORAL_TABLET | Freq: Every day | ORAL | 1 refills | Status: DC

## 2020-04-18 MED ORDER — METFORMIN HCL ER 500 MG PO TB24: 1000.0000 mg | ORAL_TABLET | Freq: Two times a day (BID) | ORAL | 1 refills | Status: DC

## 2020-04-18 NOTE — Progress Notes (Signed)
Cristian Watkins is a 59 y.o. male who presents to  [place] at [facility]  today, 04/18/20, seeking care for the following: . DM2 follow-up . Dry cough ongoing for years - questionable respiratory occupational [place], no hx tobacco abuse/exposure to secondhand smoke, worse w/ eating, no difficulty swallowing. Exam CTABL, WNL S1S2      ASSESSMENT & PLAN with other pertinent history/findings:  The primary encounter diagnosis was Controlled type 2 diabetes mellitus with diabetic nephropathy, without long-term current use of insulin ([place]). A diagnosis of Dry cough was also pertinent to this visit.    Patient Instructions  Standard of care for ALL diabetic patients includes the following   General goals:  A1C: <7.0  BP: <130/80  LDL / bad cholesterol <70  Medications:  Metformin to help control sugars and slow/prevent progression of diabetes from bad to worse, even if sugars are at goal (A1C 6.5-7.0%).  Blood pressure medicine Lisinopril or similar to help protect kidneys long-term, even if blood pressure is at goal (<130/80).   Statin medications to prevent against heart attack/stroke, even if cholesterol numbers are at goal (LDL/bad cholesterol <70).   pneumonia vaccine once before age 29  obtaining annual eye exam  frequently self-examining the feet at home  having a medical foot exam at least once per year   Cough: Xray today  Try antihistamine: Claritin, Allegra, Zyrtec or similar  If 2 weeks of the antihistamine isn't helping, fill Rx for antacid (omeprazole) and start this  Rx also printed for albuterol inhaler to try if needed        Orders Placed This Encounter  Procedures  . DG Chest 2 View  . POCT HgB A1C    Meds ordered this encounter  Medications  . RYBELSUS 7 MG TABS    Sig: Take 1 tablet by mouth daily.    Dispense:  90 tablet    Refill:  1  . metFORMIN (GLUCOPHAGE-XR) 500 MG  24 hr tablet    Sig: Take 2 tablets (1,000 mg total) by mouth 2 (two) times daily.    Dispense:  360 tablet    Refill:  1  . albuterol (VENTOLIN HFA) 108 (90 Base) MCG/ACT inhaler    Sig: Inhale 2 puffs into the lungs every 6 (six) hours as needed (cough).    Dispense:  18 g    Refill:  1  . omeprazole (PRILOSEC) 40 MG capsule    Sig: Take 1 capsule (40 mg total) by mouth daily.    Dispense:  90 capsule    Refill:  0       Follow-up instructions: Return in about 3 months (around 07/19/2020) for recheck A1C - see me sooner if needed. If cough persists, call us so we can schedule lung test .                                         BP (!) 141/87 (BP Location: Left Arm, Patient Position: Sitting)   Pulse 92   Temp 98 F (36.7 C)   Wt 210 lb (95.3 kg)   BMI 30.13 kg/m   Current Meds  Medication Sig  . AMBULATORY NON FORMULARY MEDICATION lancets, test strips of choice for testing 2x daily E11.29 Uncontrolled diabetes  . metFORMIN (GLUCOPHAGE-XR) 500 MG 24 hr tablet Take 2 tablets (1,000 mg total) by  mouth 2 (two) times daily.  . [DISCONTINUED] metFORMIN (GLUCOPHAGE-XR) 500 MG 24 hr tablet Take 1,000 mg by mouth 2 (two) times daily.    Results for orders placed or performed in visit on 04/18/20 (from the past 72 hour(s))  POCT HgB A1C     Status: Abnormal   Collection Time: 04/18/20 10:06 AM  Result Value Ref Range   Hemoglobin A1C 11.4 (A) 4.0 - 5.6 %   HbA1c POC (<> result, manual entry)     HbA1c, POC (prediabetic range)     HbA1c, POC (controlled diabetic range)      No results found.  Depression screen [facility] 2/9 12/19/2019 09/25/2018 09/20/2017  Decreased Interest 0 0 0  Down, Depressed, Hopeless 0 0 0  PHQ - 2 Score 0 0 0  Altered sleeping 0 - -  Tired, decreased energy 0 - -  Change in appetite 0 - -  Feeling bad or failure about yourself  0 - -  Trouble concentrating 0 - -  Moving slowly or fidgety/restless 0 - -  Suicidal  thoughts 0 - -  PHQ-9 Score 0 - -    GAD 7 : Generalized Anxiety Score 12/19/2019  Nervous, Anxious, on Edge 0  Control/stop worrying 0  Worry too much - different things 0  Trouble relaxing 0  Restless 0  Easily annoyed or irritable 0  Afraid - awful might happen 0  Total GAD 7 Score 0    Immunization History  Administered Date(s) Administered  . DTaP 07/06/2009  . Influenza,inj,Quad PF,6+ Mos 08/24/2013, 10/25/2014, 10/31/2015, 08/03/2016, 09/20/2017, 07/17/2018, 09/05/2019  . Janssen (J&J) SARS-COV-2 Vaccination 03/01/2020  . Pneumococcal Conjugate-13 08/03/2016  . Pneumococcal Polysaccharide-23 10/08/2016  . Tdap 07/09/2014  . Zoster Recombinat (Shingrix) 12/19/2019, 02/18/2020     All questions at time of visit were answered - patient instructed to contact office with any additional concerns or updates.  ER/RTC precautions were reviewed with the patient.  Please note: voice recognition software was used to produce this document, and typos may escape review. Please contact Dr. Sheppard Coil for any needed clarifications.

## 2020-04-18 NOTE — Patient Instructions (Addendum)
Standard of care for ALL diabetic patients includes the following   General goals:  A1C: <7.0  BP: <130/80  LDL / bad cholesterol <70  Medications:  Metformin to help control sugars and slow/prevent progression of diabetes from bad to worse, even if sugars are at goal (A1C 6.5-7.0%).  Blood pressure medicine Lisinopril or similar to help protect kidneys long-term, even if blood pressure is at goal (<130/80).   Statin medications to prevent against heart attack/stroke, even if cholesterol numbers are at goal (LDL/bad cholesterol <70).   pneumonia vaccine once before age 58  obtaining annual eye exam  frequently self-examining the feet at home  having a medical foot exam at least once per year   Cough: Xray today  Try antihistamine: Claritin, Allegra, Zyrtec or similar  If 2 weeks of the antihistamine isn't helping, fill Rx for antacid (omeprazole) and start this  Rx also printed for albuterol inhaler to try if needed

## 2020-07-21 ENCOUNTER — Ambulatory Visit: Payer: 59 | Admitting: Osteopathic Medicine

## 2020-09-11 LAB — HM COLONOSCOPY

## 2020-09-17 ENCOUNTER — Other Ambulatory Visit: Payer: Self-pay

## 2020-09-17 ENCOUNTER — Ambulatory Visit (INDEPENDENT_AMBULATORY_CARE_PROVIDER_SITE_OTHER): Payer: 59

## 2020-09-17 ENCOUNTER — Ambulatory Visit (INDEPENDENT_AMBULATORY_CARE_PROVIDER_SITE_OTHER): Payer: 59 | Admitting: Sports Medicine

## 2020-09-17 DIAGNOSIS — S43004A Unspecified dislocation of right shoulder joint, initial encounter: Secondary | ICD-10-CM

## 2020-09-17 DIAGNOSIS — S4351XA Sprain of right acromioclavicular joint, initial encounter: Secondary | ICD-10-CM

## 2020-09-17 NOTE — Progress Notes (Signed)
    Procedures performed today:    None.  Independent interpretation of notes and tests performed by another provider:   None.  Brief History, Exam, Impression, and Recommendations:    Shoulder separation, right, initial encounter This is a very pleasant 59 year old male, he does have a history of a rotator cuff tear with arthroscopic repair. He did have an MRI back in 2017 that showed significant rotator cuff disease with subscapularis tearing, as well as supraspinatus tendinopathy, acromioclavicular arthritis and glenohumeral arthritis. He was doing well after his most recent surgery until he played racquetball recently, ran into a wall, and had pain that he localizes over the top of the shoulder. On exam his rotator cuff strength, motion, all excellent, he does have a positive crossarm sign and pain over the acromioclavicular joint all consistent with a grade 1 shoulder separation. We will start conservative treatment, x-rays, and I like to see him back in approximately a month as needed. Shoulder separation rehab exercises given.    ___________________________________________ Gwen Her. Dianah Field, M.D., ABFM., CAQSM. Primary Care and [place] Instructor of [place] [employer] [facility] [employer]

## 2020-09-17 NOTE — Assessment & Plan Note (Signed)
This is a very pleasant 59 year old male, he does have a history of a rotator cuff tear with arthroscopic repair. He did have an MRI back in 2017 that showed significant rotator cuff disease with subscapularis tearing, as well as supraspinatus tendinopathy, acromioclavicular arthritis and glenohumeral arthritis. He was doing well after his most recent surgery until he played racquetball recently, ran into a wall, and had pain that he localizes over the top of the shoulder. On exam his rotator cuff strength, motion, all excellent, he does have a positive crossarm sign and pain over the acromioclavicular joint all consistent with a grade 1 shoulder separation. We will start conservative treatment, x-rays, and I like to see him back in approximately a month as needed. Shoulder separation rehab exercises given.

## 2020-10-03 ENCOUNTER — Other Ambulatory Visit: Payer: Self-pay | Admitting: Osteopathic Medicine

## 2020-10-15 ENCOUNTER — Ambulatory Visit: Payer: 59 | Admitting: Sports Medicine

## 2021-03-30 ENCOUNTER — Other Ambulatory Visit: Payer: Self-pay | Admitting: Osteopathic Medicine

## 2021-05-18 ENCOUNTER — Other Ambulatory Visit: Payer: Self-pay | Admitting: Osteopathic Medicine

## 2021-06-16 ENCOUNTER — Other Ambulatory Visit: Payer: Self-pay

## 2021-06-16 ENCOUNTER — Encounter: Payer: Self-pay | Admitting: Osteopathic Medicine

## 2021-06-16 ENCOUNTER — Ambulatory Visit (INDEPENDENT_AMBULATORY_CARE_PROVIDER_SITE_OTHER): Payer: 59 | Admitting: Osteopathic Medicine

## 2021-06-16 VITALS — BP 140/81 | HR 97 | Temp 97.9°F | Wt 194.0 lb

## 2021-06-16 DIAGNOSIS — E1121 Type 2 diabetes mellitus with diabetic nephropathy: Secondary | ICD-10-CM | POA: Diagnosis not present

## 2021-06-16 DIAGNOSIS — Z125 Encounter for screening for malignant neoplasm of prostate: Secondary | ICD-10-CM

## 2021-06-16 DIAGNOSIS — Z0000 Encounter for general adult medical examination without abnormal findings: Secondary | ICD-10-CM

## 2021-06-16 DIAGNOSIS — I10 Essential (primary) hypertension: Secondary | ICD-10-CM | POA: Diagnosis not present

## 2021-06-16 DIAGNOSIS — K7581 Nonalcoholic steatohepatitis (NASH): Secondary | ICD-10-CM | POA: Diagnosis not present

## 2021-06-16 MED ORDER — RYBELSUS 7 MG PO TABS: 1.0000 | ORAL_TABLET | Freq: Every day | ORAL | 1 refills | Status: DC

## 2021-06-16 MED ORDER — METFORMIN HCL ER 500 MG PO TB24: 1000.0000 mg | ORAL_TABLET | Freq: Two times a day (BID) | ORAL | 1 refills | Status: DC

## 2021-06-16 NOTE — Patient Instructions (Addendum)
General Preventive Care Most recent routine screening labs: ordered today.  Blood pressure goal 130/80 or less.  Tobacco: don't!  Alcohol: responsible moderation is ok for most adults - if you have concerns about your alcohol intake, please talk to me!  Exercise: as tolerated to reduce risk of cardiovascular disease and diabetes. Strength training will also prevent osteoporosis.  Mental health: if need for mental health care (medicines, counseling, other), or concerns about moods, please let me know!  Sexual / Reproductive health: if need for STD testing, or if concerns with libido/pain problems, please let me know!  Advanced Directive: Living Will and/or Healthcare Power of Attorney recommended for all adults, regardless of age or health.  Vaccines Flu vaccine: for almost everyone, every fall.  Shingles vaccine: all done! Pneumonia vaccines: booster after age 4 Tetanus booster: every 10 years, due 2025 COVID vaccine: THANKS for getting your vaccine! :)  Cancer screenings  Colon cancer screening: need records from [facility].  Prostate cancer screening: PSA blood test age 65-71 Lung cancer screening: not needed for non-smokers  Infection screenings  HIV: recommended screening at least once age 30-65, more often as needed. Gonorrhea/Chlamydia: screening as needed Hepatitis C: recommended once for everyone age 39-12 TB: certain at-risk populations, work requirements and/or travel history Other Bone Density Test: recommended for men at age 63 Abdominal Aortic Aneurysm: screening with ultrasound recommended once for men age 46-75 who have ever smoked 100+ cigarettes.

## 2021-06-16 NOTE — Progress Notes (Signed)
Cristian Watkins is a 60 y.o. male who presents to  [place] at [facility]  today, 06/16/21, seeking care for the following:  Routine check-up, overdue for labs / refills  No other complaints or concerns today      ASSESSMENT & PLAN with other pertinent findings:  The primary encounter diagnosis was Annual physical exam. Diagnoses of Controlled type 2 diabetes mellitus with diabetic nephropathy, without long-term current use of insulin ([place]), Essential hypertension, goal BP 130/80 or less, Steatohepatitis, and Prostate cancer screening were also pertinent to this visit.    Patient Instructions  General Preventive Care Most recent routine screening labs: ordered today.  Blood pressure goal 130/80 or less.  Tobacco: don't!  Alcohol: responsible moderation is ok for most adults - if you have concerns about your alcohol intake, please talk to me!  Exercise: as tolerated to reduce risk of cardiovascular disease and diabetes. Strength training will also prevent osteoporosis.  Mental health: if need for mental health care (medicines, counseling, other), or concerns about moods, please let me know!  Sexual / Reproductive health: if need for STD testing, or if concerns with libido/pain problems, please let me know!  Advanced Directive: Living Will and/or Healthcare Power of Attorney recommended for all adults, regardless of age or health.  Vaccines Flu vaccine: for almost everyone, every fall.  Shingles vaccine: all done! Pneumonia vaccines: booster after age 31 Tetanus booster: every 10 years, due 2025 COVID vaccine: THANKS for getting your vaccine! :)  Cancer screenings  Colon cancer screening: need records from [facility].  Prostate cancer screening: PSA blood test age 8-71 Lung cancer screening: not needed for non-smokers  Infection screenings  HIV: recommended screening at least once age 53-65, more often as  needed. Gonorrhea/Chlamydia: screening as needed Hepatitis C: recommended once for everyone age 20-94 TB: certain at-risk populations, work requirements and/or travel history Other Bone Density Test: recommended for men at age 11 Abdominal Aortic Aneurysm: screening with ultrasound recommended once for men age 70-75 who have ever smoked 100+ cigarettes.  Orders Placed This Encounter  Procedures   CBC   COMPLETE METABOLIC PANEL WITH GFR   Lipid panel   Hemoglobin A1c   PSA, Total with Reflex to PSA, Free    Meds ordered this encounter  Medications   metFORMIN (GLUCOPHAGE-XR) 500 MG 24 hr tablet    Sig: Take 2 tablets (1,000 mg total) by mouth 2 (two) times daily.    Dispense:  360 tablet    Refill:  1   Semaglutide (RYBELSUS) 7 MG TABS    Sig: Take 1 tablet by mouth daily.    Dispense:  90 tablet    Refill:  1     See below for relevant physical exam findings  See below for recent lab and imaging results reviewed  Medications, allergies, PMH, PSH, SocH, FamH reviewed below    Follow-up instructions: Return for RECHECK PENDING LAB RESULTS - WILL NEED TO MONITOR A1C! Marland Kitchen                                        Exam:  BP 140/81 (BP Location: Left Arm, Patient Position: Sitting, Cuff Size: Normal)   Pulse 97   Temp 97.9 F (36.6 C) (Oral)   Wt 194 lb 0.6 oz (88 kg)   BMI 27.84 kg/m  Constitutional: VS see above. General  Appearance: alert, well-developed, well-nourished, NAD Neck: No masses, trachea midline.  Respiratory: Normal respiratory effort. no wheeze, no rhonchi, no rales Cardiovascular: S1/S2 normal, no murmur, no rub/gallop auscultated. RRR.  Musculoskeletal: Gait normal. Symmetric and independent movement of all extremities Neurological: Normal balance/coordination. No tremor. Skin: warm, dry, intact.  Psychiatric: Normal judgment/insight. Normal mood and affect. Oriented x3.   No outpatient medications have been marked as  taking for the 06/16/21 encounter (Office Visit) with Emeterio Reeve, DO.    Allergies  Allergen Reactions   Canagliflozin Other (See Comments)    DKA     Patient Active Problem List   Diagnosis Date Noted   HTN (hypertension) 07/17/2018   Steatohepatitis 12/15/2016   Kidney cyst, acquired 12/07/2016   Shoulder separation, right, initial encounter 09/15/2016   Glenohumeral arthritis, right 09/15/2016   Elevated C-reactive protein (CRP) 08/13/2016   Lateral epicondylitis of both elbows 01/02/2016   Patellofemoral arthralgia of both knees 10/31/2015   Family history of prostate cancer 08/24/2013   Insomnia 08/24/2013   Controlled type 2 diabetes with renal manifestation 08/24/2013   History of sleep apnea 08/24/2013    Family History  Problem Relation Age of Onset   Prostate cancer Father     Social History   Tobacco Use  Smoking Status Never  Smokeless Tobacco Never    Past Surgical History:  Procedure Laterality Date   rotator cuff surgeries  1448,1856    Immunization History  Administered Date(s) Administered   DTaP 07/06/2009   Influenza,inj,Quad PF,6+ Mos 08/24/2013, 10/25/2014, 10/31/2015, 08/03/2016, 09/20/2017, 07/17/2018, 09/05/2019   Janssen (J&J) SARS-COV-2 Vaccination 03/01/2020   Moderna Sars-Covid-2 Vaccination 11/21/2020   Pneumococcal Conjugate-13 08/03/2016   Pneumococcal Polysaccharide-23 10/08/2016   Tdap 07/09/2014   Zoster Recombinat (Shingrix) 12/19/2019, 02/18/2020    No results found for this or any previous visit (from the past 2160 hour(s)).  No results found.     All questions at time of visit were answered - patient instructed to contact office with any additional concerns or updates. ER/RTC precautions were reviewed with the patient as applicable.   Please note: manual typing as well as voice recognition software may have been used to produce this document - typos may escape review. Please contact Dr. Sheppard Coil for any  needed clarifications.

## 2021-12-04 ENCOUNTER — Other Ambulatory Visit: Payer: Self-pay | Admitting: Osteopathic Medicine

## 2022-10-11 ENCOUNTER — Encounter: Payer: Self-pay | Admitting: Family Medicine

## 2022-10-11 ENCOUNTER — Ambulatory Visit (INDEPENDENT_AMBULATORY_CARE_PROVIDER_SITE_OTHER): Payer: 59 | Admitting: Family Medicine

## 2022-10-11 VITALS — BP 134/77 | HR 79 | Ht 70.0 in | Wt 191.0 lb

## 2022-10-11 DIAGNOSIS — Z8042 Family history of malignant neoplasm of prostate: Secondary | ICD-10-CM

## 2022-10-11 DIAGNOSIS — E1165 Type 2 diabetes mellitus with hyperglycemia: Secondary | ICD-10-CM

## 2022-10-11 DIAGNOSIS — Z125 Encounter for screening for malignant neoplasm of prostate: Secondary | ICD-10-CM

## 2022-10-11 MED ORDER — METFORMIN HCL ER 500 MG PO TB24: 1000.0000 mg | ORAL_TABLET | Freq: Two times a day (BID) | ORAL | 1 refills | Status: DC

## 2022-10-11 NOTE — Assessment & Plan Note (Signed)
-  Have ordered PSA level.  Patient is having symptoms of nocturia

## 2022-10-11 NOTE — Assessment & Plan Note (Addendum)
-  We will go ahead and order an A1c today.  Patient has been off Rybelsus for a few months.  He has however been taking metformin. - Discussed diabetic diet and exercise - Have gone ahead and ordered a CBC, CMP, lipid, A1c, microalbumin to creatinine ratio to further assess kidney function and electrolyte abnormalities -If A1c is elevated, patient said Rybelsus worked well for him and we will go ahead and start him on Rybelsus 3 mg and titrate back up to 7. -Recommended he go to the eye doctor - Patient will need foot exam at next visit. -Have gone ahead and sent refill of metformin and will wait on Rybelsus pending A1c.

## 2022-10-11 NOTE — Progress Notes (Signed)
New Patient Office Visit  Subjective    Patient ID: Cristian Watkins, male    DOB: [birth year]  Age: 61 y.o. MRN: [id number]  CC:  Chief Complaint  Patient presents with   Establish Care    HPI Cristian Watkins presents to establish care.   T2DM - has been off rybelsus 62m  - metformin 10060mXR; has been taking his wife's metformin - can't remember his most recent A1C - diet: not diabetic  - exercise: golfing and walking; peloton  - eye doctor: 1 1/2 years no new blurry vision.   Colonoscopy: when turned 59-due in 10 years Nocturia: 2 times, prostate cancer in his father. Father passed away from prostate cancer  Outpatient Encounter Medications as of 10/11/2022  Medication Sig   albuterol (VENTOLIN HFA) 108 (90 Base) MCG/ACT inhaler Inhale 2 puffs into the lungs every 6 (six) hours as needed (cough).   [DISCONTINUED] AMBULATORY NON FORMULARY MEDICATION lancets, test strips of choice for testing 2x daily E11.29 Uncontrolled diabetes   [DISCONTINUED] metFORMIN (GLUCOPHAGE-XR) 500 MG 24 hr tablet Take 2 tablets (1,000 mg total) by mouth 2 (two) times daily.   [DISCONTINUED] Semaglutide (RYBELSUS) 7 MG TABS Take 1 tablet by mouth daily. NO REFILLS. OVERDUE FOR LABS. NEEDS TO TRANSFER CARE TO NEW PCP.   metFORMIN (GLUCOPHAGE-XR) 500 MG 24 hr tablet Take 2 tablets (1,000 mg total) by mouth 2 (two) times daily.   No facility-administered encounter medications on file as of 10/11/2022.    Past Medical History:  Diagnosis Date   [facility]    Past Surgical History:  Procedure Laterality Date   rotator cuff surgeries  [phone]  Family History  Problem Relation Age of Onset   Prostate cancer Father    Diabetes Other     Social History   Socioeconomic History   Marital status: Married    Spouse name: Not on file   Number of children: Not on file   Years of education: Not on file   Highest education level: Not on file  Occupational History   Not on file   Tobacco Use   Smoking status: Former    Types: Cigarettes   Smokeless tobacco: Current    Types: Chew  Substance and Sexual Activity   Alcohol use: Yes    Comment: 4 per week   Drug use: Never   Sexual activity: Yes    Partners: Female  Other Topics Concern   Not on file  Social History Narrative   Not on file   Social Determinants of Health   Financial Resource Strain: Not on file  Food Insecurity: Not on file  Transportation Needs: Not on file  Physical Activity: Sufficiently Active (09/25/2018)   Exercise Vital Sign    Days of Exercise per Week: 4 days    Minutes of Exercise per Session: 60 min  Stress: Not on file  Social Connections: Not on file  Intimate Partner Violence: Not on file    Review of Systems  Constitutional:  Negative for chills and fever.  Respiratory:  Negative for cough and shortness of breath.   Cardiovascular:  Negative for chest pain.  Genitourinary:        Nocturia  Neurological:  Negative for headaches.        Objective    BP 134/77   Pulse 79   Ht 5' 10"  (1.778 m)   Wt 191 lb (86.6 kg)   SpO2 98%   BMI 27.41 kg/m  Physical Exam Vitals and nursing note reviewed.  Constitutional:      General: He is not in acute distress.    Appearance: Normal appearance.  HENT:     Head: Normocephalic and atraumatic.     Right Ear: External ear normal.     Left Ear: External ear normal.     Nose: Nose normal.  Eyes:     Conjunctiva/sclera: Conjunctivae normal.  Cardiovascular:     Rate and Rhythm: Normal rate and regular rhythm.  Pulmonary:     Effort: Pulmonary effort is normal.     Breath sounds: Normal breath sounds.  Neurological:     General: No focal deficit present.     Mental Status: He is alert and oriented to person, place, and time.  Psychiatric:        Mood and Affect: Mood normal.        Behavior: Behavior normal.        Thought Content: Thought content normal.        Judgment: Judgment normal.         Assessment & Plan:   Problem List Items Addressed This Visit       Endocrine   Type 2 diabetes mellitus with hyperglycemia, without long-term current use of insulin ([place]) - Primary    -We will go ahead and order an A1c today.  Patient has been off Rybelsus for a few months.  He has however been taking metformin. - Discussed diabetic diet and exercise - Have gone ahead and ordered a CBC, CMP, lipid, A1c, microalbumin to creatinine ratio to further assess kidney function and electrolyte abnormalities -If A1c is elevated, patient said Rybelsus worked well for him and we will go ahead and start him on Rybelsus 3 mg and titrate back up to 7. -Recommended he go to the eye doctor - Patient will need foot exam at next visit. -Have gone ahead and sent refill of metformin and will wait on Rybelsus pending A1c.      Relevant Medications   metFORMIN (GLUCOPHAGE-XR) 500 MG 24 hr tablet   Other Relevant Orders   CBC   COMPLETE METABOLIC PANEL WITH GFR   Lipid panel   HgB A1c   Microalbumin / creatinine urine ratio     Other   Family history of prostate cancer (Chronic)    -Have ordered PSA level.  Patient is having symptoms of nocturia      Other Visit Diagnoses     Prostate cancer screening       Relevant Orders   PSA, total and free       Return in about 3 months (around 01/11/2023) for Diabetes follow-up.   Owens Loffler, DO

## 2022-10-12 ENCOUNTER — Other Ambulatory Visit: Payer: Self-pay | Admitting: Family Medicine

## 2022-10-12 MED ORDER — TIRZEPATIDE 2.5 MG/0.5ML ~~LOC~~ SOAJ: 2.5000 mg | SUBCUTANEOUS | 3 refills | Status: DC

## 2022-10-12 MED ORDER — ATORVASTATIN CALCIUM 80 MG PO TABS: 80.0000 mg | ORAL_TABLET | Freq: Every day | ORAL | 3 refills | Status: DC

## 2022-10-12 NOTE — Progress Notes (Signed)
Pt A1c elevated to 12.4 have sent in mounjaro as well as lipitor 50m. Unable to tolerate sglt2 since he has dka with this per his allergy list

## 2022-10-13 ENCOUNTER — Other Ambulatory Visit: Payer: Self-pay | Admitting: Family Medicine

## 2022-10-13 DIAGNOSIS — R809 Proteinuria, unspecified: Secondary | ICD-10-CM

## 2022-10-13 LAB — COMPLETE METABOLIC PANEL WITH GFR
AG Ratio: 1.9 (calc) (ref 1.0–2.5)
ALT: 38 U/L (ref 9–46)
AST: 22 U/L (ref 10–35)
Albumin: 4.9 g/dL (ref 3.6–5.1)
Alkaline phosphatase (APISO): 66 U/L (ref 35–144)
BUN: 10 mg/dL (ref 7–25)
CO2: 25 mmol/L (ref 20–32)
Calcium: 9.6 mg/dL (ref 8.6–10.3)
Chloride: 99 mmol/L (ref 98–110)
Creat: 0.7 mg/dL (ref 0.70–1.35)
Globulin: 2.6 g/dL (calc) (ref 1.9–3.7)
Glucose, Bld: 266 mg/dL — ABNORMAL HIGH (ref 65–99)
Potassium: 4.5 mmol/L (ref 3.5–5.3)
Sodium: 136 mmol/L (ref 135–146)
Total Bilirubin: 0.7 mg/dL (ref 0.2–1.2)
Total Protein: 7.5 g/dL (ref 6.1–8.1)
eGFR: 104 mL/min/{1.73_m2} (ref >=60)

## 2022-10-13 LAB — LIPID PANEL
Cholesterol: 134 mg/dL (ref <200)
HDL: 60 mg/dL (ref >=40)
LDL Cholesterol (Calc): 60 mg/dL (calc)
Non-HDL Cholesterol (Calc): 74 mg/dL (calc) (ref <130)
Total CHOL/HDL Ratio: 2.2 (calc) (ref <5.0)
Triglycerides: 68 mg/dL (ref <150)

## 2022-10-13 LAB — PSA, TOTAL AND FREE
PSA, % Free: 14 % (calc) — ABNORMAL LOW (ref >25)
PSA, Free: 0.3 ng/mL
PSA, Total: 2.2 ng/mL (ref <=4.0)

## 2022-10-13 LAB — CBC
HCT: 47.6 % (ref 38.5–50.0)
Hemoglobin: 15.7 g/dL (ref 13.2–17.1)
MCH: 28 pg (ref 27.0–33.0)
MCHC: 33 g/dL (ref 32.0–36.0)
MCV: 84.8 fL (ref 80.0–100.0)
MPV: 11.2 fL (ref 7.5–12.5)
Platelets: 208 10*3/uL (ref 140–400)
RBC: 5.61 10*6/uL (ref 4.20–5.80)
RDW: 13.1 % (ref 11.0–15.0)
WBC: 4.3 10*3/uL (ref 3.8–10.8)

## 2022-10-13 LAB — MICROALBUMIN / CREATININE URINE RATIO
Creatinine, Urine: 85 mg/dL (ref 20–320)
Microalb Creat Ratio: 127 mcg/mg creat — ABNORMAL HIGH (ref <30)
Microalb, Ur: 10.8 mg/dL

## 2022-10-13 LAB — HEMOGLOBIN A1C
Hgb A1c MFr Bld: 12.4 % of total Hgb — ABNORMAL HIGH (ref <5.7)
Mean Plasma Glucose: 309 mg/dL
eAG (mmol/L): 17.1 mmol/L

## 2022-10-13 NOTE — Progress Notes (Signed)
Nurse called and updated pt on labwork. Have sent Renal US to eval for microalbuminuria. Have also sent in [facility] for DM managment

## 2022-10-22 ENCOUNTER — Ambulatory Visit (INDEPENDENT_AMBULATORY_CARE_PROVIDER_SITE_OTHER): Payer: 59

## 2022-10-22 DIAGNOSIS — R809 Proteinuria, unspecified: Secondary | ICD-10-CM

## 2022-11-18 LAB — HM DIABETES EYE EXAM

## 2022-11-26 ENCOUNTER — Other Ambulatory Visit: Payer: Self-pay | Admitting: *Deleted

## 2022-11-26 DIAGNOSIS — E1165 Type 2 diabetes mellitus with hyperglycemia: Secondary | ICD-10-CM

## 2022-11-26 MED ORDER — ATORVASTATIN CALCIUM 80 MG PO TABS: 80.0000 mg | ORAL_TABLET | Freq: Every day | ORAL | 3 refills | Status: DC

## 2022-11-26 MED ORDER — TIRZEPATIDE 2.5 MG/0.5ML ~~LOC~~ SOAJ: 2.5000 mg | SUBCUTANEOUS | 3 refills | Status: DC

## 2022-11-26 MED ORDER — METFORMIN HCL ER 500 MG PO TB24: 1000.0000 mg | ORAL_TABLET | Freq: Two times a day (BID) | ORAL | 1 refills | Status: DC

## 2022-12-16 ENCOUNTER — Encounter: Payer: Self-pay | Admitting: Family Medicine

## 2023-01-03 ENCOUNTER — Ambulatory Visit: Payer: 59 | Admitting: Family Medicine

## 2023-01-12 ENCOUNTER — Ambulatory Visit (INDEPENDENT_AMBULATORY_CARE_PROVIDER_SITE_OTHER): Payer: 59 | Admitting: Family Medicine

## 2023-01-12 ENCOUNTER — Encounter: Payer: Self-pay | Admitting: Family Medicine

## 2023-01-12 VITALS — BP 132/75 | HR 76 | Ht 70.0 in | Wt 190.0 lb

## 2023-01-12 DIAGNOSIS — E1165 Type 2 diabetes mellitus with hyperglycemia: Secondary | ICD-10-CM | POA: Diagnosis not present

## 2023-01-12 DIAGNOSIS — Z23 Encounter for immunization: Secondary | ICD-10-CM

## 2023-01-12 DIAGNOSIS — E785 Hyperlipidemia, unspecified: Secondary | ICD-10-CM | POA: Diagnosis not present

## 2023-01-12 DIAGNOSIS — I10 Essential (primary) hypertension: Secondary | ICD-10-CM

## 2023-01-12 MED ORDER — TIRZEPATIDE 5 MG/0.5ML ~~LOC~~ SOAJ: 5.0000 mg | SUBCUTANEOUS | 0 refills | Status: DC

## 2023-01-12 MED ORDER — TIRZEPATIDE 7.5 MG/0.5ML ~~LOC~~ SOAJ: 7.5000 mg | SUBCUTANEOUS | 1 refills | Status: DC

## 2023-01-12 NOTE — Assessment & Plan Note (Signed)
Tolerating atorvastatin.  Continue at current strength.

## 2023-01-12 NOTE — Assessment & Plan Note (Signed)
A1c has improved.  Tolerating mounjaro well.  Continue titration to 7.67m until he is seen by Dr. WMel Almondagain.  Encouraged continued dietary changes.  Foot exam completed today.

## 2023-01-12 NOTE — Patient Instructions (Signed)
A1c has improved.  Let's continue to move up on the mounjaro.  Increase to 56m weekly for 4 weeks then increase to 7.568m  Stay at 7.53m51mntil next follow up with Dr. WacMel Almond 3-4 months.   Continue metformin at current strength.    Continue with dietary changes.

## 2023-01-12 NOTE — Assessment & Plan Note (Signed)
BP is well controlled off medication.  Can consider addition of low dose ACE-I if BP is starting to increase.

## 2023-01-12 NOTE — Progress Notes (Signed)
Cristian Watkins - 62 y.o. male MRN [id number]  Date of birth: 30-Jan-1961  Subjective Chief Complaint  Patient presents with   Diabetes    HPI Cristian Watkins is a 62 y.o. male here today for follow up.   He has a history of T2DM and HLD.    Diabetes was treated with metformin alone previously.  Mounjaro added at last visit.  He has remained at 2.75m weekly since last visit.  So far he is tolerating this well without side effects from this.  In addition to medication he has worked on dietary changes and reduced his intake of sweets.  Weight is down about 3lbs. He remains pretty active.  He continues to tolerate atorvastatin for co-morbid HLD.  His LDL is at goal.   ROS:  A comprehensive ROS was completed and negative except as noted per HPI  Allergies  Allergen Reactions   Canagliflozin Other (See Comments)    DKA     Past Medical History:  Diagnosis Date   Diabetes ([facility]     Past Surgical History:  Procedure Laterality Date   rotator cuff surgeries  [phone]   Social History   Socioeconomic History   Marital status: Married    Spouse name: Not on file   Number of children: Not on file   Years of education: Not on file   Highest education level: Not on file  Occupational History   Not on file  Tobacco Use   Smoking status: Former    Types: Cigarettes   Smokeless tobacco: Current    Types: Chew  Substance and Sexual Activity   Alcohol use: Yes    Comment: 4 per week   Drug use: Never   Sexual activity: Yes    Partners: Female  Other Topics Concern   Not on file  Social History Narrative   Not on file   Social Determinants of Health   Financial Resource Strain: Not on file  Food Insecurity: Not on file  Transportation Needs: Not on file  Physical Activity: Sufficiently Active (09/25/2018)   Exercise Vital Sign    Days of Exercise per Week: 4 days    Minutes of Exercise per Session: 60 min  Stress: Not on file  Social Connections: Not on file     Family History  Problem Relation Age of Onset   Prostate cancer Father    Diabetes Other     Health Maintenance  Topic Date Due   COLONOSCOPY (Pts [phone] coverage will need to be confirmed)  07/24/2020   INFLUENZA VACCINE  06/22/2022   COVID-19 Vaccine (3 - 2023-24 season) 07/23/2022   HEMOGLOBIN A1C  04/11/2023   Diabetic kidney evaluation - eGFR measurement  10/12/2023   Diabetic kidney evaluation - Urine ACR  10/12/2023   OPHTHALMOLOGY EXAM  11/19/2023   FOOT EXAM  01/13/2024   DTaP/Tdap/Td (3 - Td or Tdap) 07/09/2024   Hepatitis C Screening  Completed   HIV Screening  Completed   Zoster Vaccines- Shingrix  Completed   HPV VACCINES  Aged Out     ----------------------------------------------------------------------------------------------------------------------------------------------------------------------------------------------------------------- Physical Exam BP 132/75 (BP Location: Left Arm, Patient Position: Sitting, Cuff Size: Normal)   Pulse 76   Ht 5' 10"$  (1.778 m)   Wt 190 lb (86.2 kg)   SpO2 99%   BMI 27.26 kg/m   Physical Exam Constitutional:      Appearance: Normal appearance.  HENT:     Head: Normocephalic and atraumatic.  Eyes:  General: No scleral icterus. Cardiovascular:     Rate and Rhythm: Normal rate and regular rhythm.  Pulmonary:     Effort: Pulmonary effort is normal.     Breath sounds: Normal breath sounds.  Musculoskeletal:     Cervical back: Neck supple.  Neurological:     Mental Status: He is alert.  Psychiatric:        Mood and Affect: Mood normal.        Behavior: Behavior normal.     ------------------------------------------------------------------------------------------------------------------------------------------------------------------------------------------------------------------- Assessment and Plan  HTN (hypertension) BP is well controlled off medication.  Can consider addition of low dose  ACE-I if BP is starting to increase.   Type 2 diabetes mellitus with hyperglycemia, without long-term current use of insulin (HCC) A1c has improved.  Tolerating mounjaro well.  Continue titration to 7.66m until he is seen by Dr. WMel Almondagain.  Encouraged continued dietary changes.  Foot exam completed today.   HLD (hyperlipidemia) Tolerating atorvastatin.  Continue at current strength.     Meds ordered this encounter  Medications   tirzepatide (MOUNJARO) 5 MG/0.5ML Pen    Sig: Inject 5 mg into the skin once a week. Increase to 7.543mafter 4 weeks.    Dispense:  2 mL    Refill:  0   tirzepatide (MOUNJARO) 7.5 MG/0.5ML Pen    Sig: Inject 7.5 mg into the skin once a week.    Dispense:  6 mL    Refill:  1    No follow-ups on file.    This visit occurred during the SARS-CoV-2 public health emergency.  Safety protocols were in place, including screening questions prior to the visit, additional usage of staff PPE, and extensive cleaning of exam room while observing appropriate contact time as indicated for disinfecting solutions.

## 2023-01-21 ENCOUNTER — Encounter: Payer: Self-pay | Admitting: Family Medicine

## 2023-01-25 ENCOUNTER — Telehealth: Payer: Self-pay

## 2023-01-25 NOTE — Telephone Encounter (Signed)
Initiated Prior authorization [id number] '5MG'$ /0.5ML pen-injectors Via: Covermymeds Case/Key:[id number] Status: approved as of 01/25/23 Reason:Coverage Start Date:12/26/2022;Coverage End Date:01/25/2024; Notified Pt via: Mychart, called pt

## 2023-02-22 ENCOUNTER — Telehealth: Payer: Self-pay

## 2023-02-22 MED ORDER — TIRZEPATIDE 7.5 MG/0.5ML ~~LOC~~ SOAJ: 7.5000 mg | SUBCUTANEOUS | 1 refills | Status: DC

## 2023-02-22 NOTE — Telephone Encounter (Signed)
Pls contact the patient and advise that medication was sent to the pharmacy.

## 2023-02-22 NOTE — Telephone Encounter (Signed)
Patient was taking Moujaro 5 for 4 weeks, and is supposed to move up to 7.5 after 4 weeks which will be Thursday 02/24/2023, patient hasn't heard from his pharmacy, please advise, thanks.

## 2023-04-12 ENCOUNTER — Encounter: Payer: Self-pay | Admitting: Family Medicine

## 2023-04-12 ENCOUNTER — Ambulatory Visit (INDEPENDENT_AMBULATORY_CARE_PROVIDER_SITE_OTHER): Payer: 59 | Admitting: Family Medicine

## 2023-04-12 VITALS — BP 112/67 | HR 76 | Ht 70.0 in | Wt 187.8 lb

## 2023-04-12 DIAGNOSIS — Z7984 Long term (current) use of oral hypoglycemic drugs: Secondary | ICD-10-CM | POA: Diagnosis not present

## 2023-04-12 DIAGNOSIS — E1165 Type 2 diabetes mellitus with hyperglycemia: Secondary | ICD-10-CM | POA: Diagnosis not present

## 2023-04-12 DIAGNOSIS — E663 Overweight: Secondary | ICD-10-CM | POA: Diagnosis not present

## 2023-04-12 LAB — POCT GLYCOSYLATED HEMOGLOBIN (HGB A1C): HbA1c POC (<> result, manual entry): 7.4 % (ref 4.0–5.6)

## 2023-04-12 MED ORDER — TIRZEPATIDE 10 MG/0.5ML ~~LOC~~ SOAJ
10.0000 mg | SUBCUTANEOUS | 0 refills | Status: DC
Start: 2023-04-12 — End: 2023-05-11

## 2023-04-12 MED ORDER — TIRZEPATIDE 12.5 MG/0.5ML ~~LOC~~ SOAJ
12.5000 mg | SUBCUTANEOUS | 3 refills | Status: DC
Start: 2023-04-12 — End: 2023-07-13

## 2023-04-12 NOTE — Assessment & Plan Note (Addendum)
-   poc A1c 7.4 - pt doing well controlling sugars  - would like to see more weight loss, encouraged increase in exercise  - pt up to date on eye exam- feb 2024 - will go ahead an add next two titration doses of mounjaro  - follow up 3 months for DM

## 2023-04-12 NOTE — Progress Notes (Signed)
Established patient visit   Patient: Cristian Watkins   DOB: 1961/02/21   62 y.o. Male  MRN: [id number] Visit Date: 04/12/2023  Today's healthcare provider: Charlton Amor, DO   Chief Complaint  Patient presents with   Diabetes    [name] in office for DM check up  - doing well with [facility] but concerned may have difficulty getting this with future refills.     SUBJECTIVE    Chief Complaint  Patient presents with   Diabetes    [name] in office for DM check up  - doing well with [facility] but concerned may have difficulty getting this with future refills.    HPI  Pt presents for T2DM follow up.   T2DM - on mounjaro 7.5mg  and metformin - last A1c in November was 12  - currently does not have side effects - atorvastatin 80mg   Overweight - pt has had about a 7lb weight loss with mounjaro.  Review of Systems  Constitutional:  Negative for activity change, fatigue and fever.  Respiratory:  Negative for cough and shortness of breath.   Cardiovascular:  Negative for chest pain.  Gastrointestinal:  Negative for abdominal pain.  Genitourinary:  Negative for difficulty urinating.       Current Meds  Medication Sig   atorvastatin (LIPITOR) 80 MG tablet Take 1 tablet (80 mg total) by mouth daily.   metFORMIN (GLUCOPHAGE-XR) 500 MG 24 hr tablet Take 2 tablets (1,000 mg total) by mouth 2 (two) times daily.   tirzepatide (MOUNJARO) 10 MG/0.5ML Pen Inject 10 mg into the skin once a week.   tirzepatide (MOUNJARO) 12.5 MG/0.5ML Pen Inject 12.5 mg into the skin once a week.   tirzepatide (MOUNJARO) 7.5 MG/0.5ML Pen Inject 7.5 mg into the skin once a week.    OBJECTIVE    BP 112/67   Pulse 76   Ht 5\' 10"  (1.778 m)   Wt 187 lb 12 oz (85.2 kg)   SpO2 100%   BMI 26.94 kg/m   Physical Exam Vitals and nursing note reviewed.  Constitutional:      General: He is not in acute distress.    Appearance: Normal appearance.  HENT:     Head: Normocephalic and atraumatic.      Right Ear: External ear normal.     Left Ear: External ear normal.     Nose: Nose normal.  Eyes:     Conjunctiva/sclera: Conjunctivae normal.  Cardiovascular:     Rate and Rhythm: Normal rate and regular rhythm.  Pulmonary:     Effort: Pulmonary effort is normal.     Breath sounds: Normal breath sounds.  Neurological:     General: No focal deficit present.     Mental Status: He is alert and oriented to person, place, and time.  Psychiatric:        Mood and Affect: Mood normal.        Behavior: Behavior normal.        Thought Content: Thought content normal.        Judgment: Judgment normal.          ASSESSMENT & PLAN    Problem List Items Addressed This Visit       Endocrine   Type 2 diabetes mellitus with hyperglycemia, without long-term current use of insulin (HCC) - Primary    - poc A1c 7.4 - pt doing well controlling sugars  - would like to see more weight loss, encouraged increase in exercise  -  pt up to date on eye exam- feb 2024 - will go ahead an add next two titration doses of mounjaro  - follow up 3 months for DM      Relevant Medications   tirzepatide (MOUNJARO) 10 MG/0.5ML Pen   tirzepatide (MOUNJARO) 12.5 MG/0.5ML Pen   Other Relevant Orders   POCT glycosylated hemoglobin (Hb A1C) (Completed)   Microalbumin / creatinine urine ratio   COMPLETE METABOLIC PANEL WITH GFR     Other   Overweight (BMI 25.0-29.9)    - pt has had about 7lb weight loss since starting mounjaro, encouraged increase in exercise so we can exacerbate weight loss side effects         Return in about 3 months (around 07/13/2023) for T2DM .      Meds ordered this encounter  Medications   tirzepatide (MOUNJARO) 10 MG/0.5ML Pen    Sig: Inject 10 mg into the skin once a week.    Dispense:  6 mL    Refill:  0   tirzepatide (MOUNJARO) 12.5 MG/0.5ML Pen    Sig: Inject 12.5 mg into the skin once a week.    Dispense:  6 mL    Refill:  3    Orders Placed This Encounter   Procedures   Microalbumin / creatinine urine ratio   COMPLETE METABOLIC PANEL WITH GFR   POCT glycosylated hemoglobin (Hb A1C)     Charlton Amor, DO  [facility] at [facility] [phone] (phone) [phone] (fax)  [facility]

## 2023-04-12 NOTE — Assessment & Plan Note (Signed)
-   pt has had about 7lb weight loss since starting mounjaro, encouraged increase in exercise so we can exacerbate weight loss side effects

## 2023-04-13 LAB — COMPLETE METABOLIC PANEL WITH GFR
AG Ratio: 2.1 (calc) (ref 1.0–2.5)
ALT: 27 U/L (ref 9–46)
AST: 12 U/L (ref 10–35)
Albumin: 4.5 g/dL (ref 3.6–5.1)
Alkaline phosphatase (APISO): 51 U/L (ref 35–144)
BUN: 11 mg/dL (ref 7–25)
CO2: 28 mmol/L (ref 20–32)
Calcium: 9.3 mg/dL (ref 8.6–10.3)
Chloride: 100 mmol/L (ref 98–110)
Creat: 0.75 mg/dL (ref 0.70–1.35)
Globulin: 2.1 g/dL (calc) (ref 1.9–3.7)
Glucose, Bld: 224 mg/dL — ABNORMAL HIGH (ref 65–99)
Potassium: 4 mmol/L (ref 3.5–5.3)
Sodium: 137 mmol/L (ref 135–146)
Total Bilirubin: 0.6 mg/dL (ref 0.2–1.2)
Total Protein: 6.6 g/dL (ref 6.1–8.1)
eGFR: 102 mL/min/{1.73_m2} (ref >=60)

## 2023-04-13 LAB — MICROALBUMIN / CREATININE URINE RATIO
Creatinine, Urine: 35 mg/dL (ref 20–320)
Microalb Creat Ratio: 11 mg/g creat (ref <30)
Microalb, Ur: 0.4 mg/dL

## 2023-04-20 ENCOUNTER — Telehealth: Payer: Self-pay | Admitting: Family Medicine

## 2023-04-20 NOTE — Telephone Encounter (Signed)
[facility] pharmacy called they're out of the 10mg  Mounjaro but they have the 12.5mg  they would like clafication if this is ok to take patient hasn't taken any of the 10mg  please advise

## 2023-04-20 NOTE — Telephone Encounter (Signed)
[facility] pharmacy called they're out of the 10mg  Mounjaro but they have the 12.5mg  they would like clafication if this is ok to take patient hasn't taken any of the 10mg  please advise, patients pharmacy would likw to know if he can do the 12.5 since 10 mg is out of stock everywhere, please advise, thanks.

## 2023-04-24 ENCOUNTER — Encounter: Payer: Self-pay | Admitting: Family Medicine

## 2023-04-25 MED ORDER — TIRZEPATIDE 5 MG/0.5ML ~~LOC~~ SOAJ: 5.0000 mg | SUBCUTANEOUS | 0 refills | Status: DC

## 2023-05-05 ENCOUNTER — Telehealth: Payer: Self-pay | Admitting: Family Medicine

## 2023-05-05 ENCOUNTER — Other Ambulatory Visit: Payer: Self-pay | Admitting: Family Medicine

## 2023-05-05 NOTE — Telephone Encounter (Signed)
Pt stopped in office this morning to make provider aware that pharmacy is faxing over request for prior authorization for Mounjaro 5mg /0.66ml pens. He normally uses the 10's but pharmacy is out of them. Pharmacy on file is correct.

## 2023-05-10 NOTE — Telephone Encounter (Signed)
Left message for a return call

## 2023-05-11 ENCOUNTER — Other Ambulatory Visit: Payer: Self-pay | Admitting: Family Medicine

## 2023-05-11 DIAGNOSIS — E1165 Type 2 diabetes mellitus with hyperglycemia: Secondary | ICD-10-CM

## 2023-05-11 MED ORDER — TIRZEPATIDE 12.5 MG/0.5ML ~~LOC~~ SOAJ
12.5000 mg | SUBCUTANEOUS | 3 refills | Status: DC
Start: 2023-05-11 — End: 2024-01-17

## 2023-05-11 NOTE — Telephone Encounter (Signed)
Patient informed. 

## 2023-07-13 ENCOUNTER — Encounter: Payer: Self-pay | Admitting: Family Medicine

## 2023-07-13 ENCOUNTER — Ambulatory Visit: Payer: 59 | Admitting: Family Medicine

## 2023-07-13 VITALS — BP 118/71 | HR 81 | Resp 18 | Ht 70.0 in | Wt 174.0 lb

## 2023-07-13 DIAGNOSIS — E1165 Type 2 diabetes mellitus with hyperglycemia: Secondary | ICD-10-CM

## 2023-07-13 DIAGNOSIS — Z7984 Long term (current) use of oral hypoglycemic drugs: Secondary | ICD-10-CM

## 2023-07-13 DIAGNOSIS — F5101 Primary insomnia: Secondary | ICD-10-CM

## 2023-07-13 LAB — POCT GLYCOSYLATED HEMOGLOBIN (HGB A1C): Hemoglobin A1C: 6.5 % — AB (ref 4.0–5.6)

## 2023-07-13 MED ORDER — ATORVASTATIN CALCIUM 80 MG PO TABS: 80.0000 mg | ORAL_TABLET | Freq: Every day | ORAL | 3 refills | Status: DC

## 2023-07-13 MED ORDER — TRAZODONE HCL 50 MG PO TABS
25.0000 mg | ORAL_TABLET | Freq: Every evening | ORAL | 3 refills | Status: DC | PRN
Start: 2023-07-13 — End: 2024-07-03

## 2023-07-13 NOTE — Patient Instructions (Signed)
Weight goal is 164lbs

## 2023-07-13 NOTE — Progress Notes (Signed)
Established patient visit   Patient: Cristian Watkins   DOB: 10-19-1961   62 y.o. Male  MRN: [id number] Visit Date: 07/13/2023  Today's healthcare provider: Charlton Amor, DO   Chief Complaint  Patient presents with   Follow-up    A1C last one 04/12/23 7.4    SUBJECTIVE    Chief Complaint  Patient presents with   Follow-up    A1C last one 04/12/23 7.4   HPI HPI     Follow-up    Additional comments: A1C last one 04/12/23 7.4      Last edited by Roselyn Reef, CMA on 07/13/2023  8:04 AM.      Pt presents for T2DM follow up. Currently on mounjaro 12.5mg  and doing well with no side effects. At last visit he was encouraged to exercise to help exacerbate weight loss. A1C three months ago was 7.4 and he is due today for repeat. Since his last visit he has lost 13lbs.  Review of Systems  Constitutional:  Negative for activity change, fatigue and fever.  Respiratory:  Negative for cough and shortness of breath.   Cardiovascular:  Negative for chest pain.  Gastrointestinal:  Negative for abdominal pain.  Genitourinary:  Negative for difficulty urinating.       Current Meds  Medication Sig   traZODone (DESYREL) 50 MG tablet Take 0.5-1 tablets (25-50 mg total) by mouth at bedtime as needed for sleep.    OBJECTIVE    BP 118/71 (BP Location: Left Arm, Patient Position: Sitting, Cuff Size: Large)   Pulse 81   Resp 18   Ht 5\' 10"  (1.778 m)   Wt 174 lb (78.9 kg)   SpO2 99%   BMI 24.97 kg/m   Physical Exam Vitals and nursing note reviewed.  Constitutional:      General: He is not in acute distress.    Appearance: Normal appearance.  HENT:     Head: Normocephalic and atraumatic.     Right Ear: External ear normal.     Left Ear: External ear normal.     Nose: Nose normal.  Eyes:     Conjunctiva/sclera: Conjunctivae normal.  Cardiovascular:     Rate and Rhythm: Normal rate and regular rhythm.  Pulmonary:     Effort: Pulmonary effort is normal.     Breath sounds:  Normal breath sounds.  Neurological:     General: No focal deficit present.     Mental Status: He is alert and oriented to person, place, and time.  Psychiatric:        Mood and Affect: Mood normal.        Behavior: Behavior normal.        Thought Content: Thought content normal.        Judgment: Judgment normal.          ASSESSMENT & PLAN    Problem List Items Addressed This Visit       Endocrine   Type 2 diabetes mellitus with hyperglycemia, without long-term current use of insulin (HCC) - Primary    Poc A1c today is 6.5 - continue mounjaro 12.5mg   - continue good diet and exercise        Relevant Medications   atorvastatin (LIPITOR) 80 MG tablet   Other Relevant Orders   POCT HgB A1C (Completed)     Other   Insomnia    - secondary to night shift work  - pt has tried multiple medications in the past including [place] and says  it did not work - we will try trazodone and if this does not work can refer to sleep medicine for better help. I do believe sleep issues are related to his years of shift work       Return in about 6 months (around 01/13/2024) for T2DM follow up .      Meds ordered this encounter  Medications   atorvastatin (LIPITOR) 80 MG tablet    Sig: Take 1 tablet (80 mg total) by mouth daily.    Dispense:  90 tablet    Refill:  3   traZODone (DESYREL) 50 MG tablet    Sig: Take 0.5-1 tablets (25-50 mg total) by mouth at bedtime as needed for sleep.    Dispense:  30 tablet    Refill:  3    Orders Placed This Encounter  Procedures   POCT HgB A1C     Charlton Amor, DO  [facility] at [facility] [phone] (phone) [phone] (fax)  [facility]

## 2023-07-13 NOTE — Assessment & Plan Note (Signed)
Poc A1c today is 6.5 - continue mounjaro 12.5mg   - continue good diet and exercise

## 2023-07-13 NOTE — Assessment & Plan Note (Signed)
-   secondary to night shift work  - pt has tried multiple medications in the past including [place] and says it did not work - we will try trazodone and if this does not work can refer to sleep medicine for better help. 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

## 2023-10-02 ENCOUNTER — Other Ambulatory Visit: Payer: Self-pay | Admitting: Family Medicine

## 2023-10-02 DIAGNOSIS — E1165 Type 2 diabetes mellitus with hyperglycemia: Secondary | ICD-10-CM

## 2023-10-24 ENCOUNTER — Encounter: Payer: Self-pay | Admitting: Family Medicine

## 2023-10-26 ENCOUNTER — Telehealth: Payer: Self-pay

## 2023-10-26 NOTE — Telephone Encounter (Signed)
Initiated Prior authorization TKZ:SWFUXNAT 5MG /0.5ML pen-injectors Via: Covermymeds Case/Key:[id number] Status: approved as of 10/26/23 Reason:Authorization Expiration Date: 10/24/2024 Notified Pt via: Mychart

## 2023-12-13 ENCOUNTER — Other Ambulatory Visit: Payer: Self-pay | Admitting: Family Medicine

## 2023-12-13 DIAGNOSIS — E1165 Type 2 diabetes mellitus with hyperglycemia: Secondary | ICD-10-CM

## 2024-01-13 ENCOUNTER — Ambulatory Visit: Payer: 59 | Admitting: Family Medicine

## 2024-01-17 ENCOUNTER — Encounter: Payer: Self-pay | Admitting: Family Medicine

## 2024-01-17 ENCOUNTER — Ambulatory Visit (INDEPENDENT_AMBULATORY_CARE_PROVIDER_SITE_OTHER): Payer: 59 | Admitting: Family Medicine

## 2024-01-17 VITALS — BP 110/65 | HR 86 | Temp 98.3°F | Resp 18 | Ht 70.0 in | Wt 173.4 lb

## 2024-01-17 DIAGNOSIS — R051 Acute cough: Secondary | ICD-10-CM

## 2024-01-17 DIAGNOSIS — E1165 Type 2 diabetes mellitus with hyperglycemia: Secondary | ICD-10-CM | POA: Diagnosis not present

## 2024-01-17 DIAGNOSIS — Z7984 Long term (current) use of oral hypoglycemic drugs: Secondary | ICD-10-CM | POA: Diagnosis not present

## 2024-01-17 DIAGNOSIS — Z23 Encounter for immunization: Secondary | ICD-10-CM

## 2024-01-17 LAB — POCT GLYCOSYLATED HEMOGLOBIN (HGB A1C): Hemoglobin A1C: 6.5 % — AB (ref 4.0–5.6)

## 2024-01-17 MED ORDER — PREDNISONE 10 MG PO TABS: 10.0000 mg | ORAL_TABLET | Freq: Every day | ORAL | 0 refills | Status: DC

## 2024-01-17 MED ORDER — TIRZEPATIDE 12.5 MG/0.5ML ~~LOC~~ SOAJ
12.5000 mg | SUBCUTANEOUS | 2 refills | Status: DC
Start: 2024-01-17 — End: 2024-07-03

## 2024-01-17 NOTE — Progress Notes (Signed)
 Established Patient Office Visit  Subjective   Patient ID: Cristian Watkins, male    DOB: 1961-06-13  Age: 63 y.o. MRN: [id number]  Chief Complaint  Patient presents with   Medical Management of Chronic Issues    Patient is here for a 6 month follow up , for DM    HPI  Diabetes: Medication compliance: Taking Mounjaro 12.5 mg and metformin XR 500 mg as prescribed.  Denies chest pain, shortness of breath, vision changes, polydipsia, polyphagia, polyuria. Denies hypoglycemia.  Pertinent lab work: A1C: last POC A1C 07/13/23: 6.5, down from 12.4.  Monitoring: blood sugar readings at home: does not check at home.           Continue current medication regimen: no changes today.   Well controlled: POC A1C today: 6.5 Follow-up: 3 months  Tolerating well without nausea or other reported symptoms.  Cough:  Started one month ago with an illness. Using cough suppressant at home. Lingering cough.  Taking expectorant for sputum. No fever or chills. No shortness of breath. Does not feel ill.       ROS    Objective:     BP 110/65   Pulse 86   Temp 98.3 F (36.8 C) (Oral)   Resp 18   Ht 5\' 10"  (1.778 m)   Wt 173 lb 6.4 oz (78.7 kg)   SpO2 98%   BMI 24.88 kg/m  BP Readings from Last 3 Encounters:  01/17/24 110/65  07/13/23 118/71  04/12/23 112/67      Physical Exam Vitals and nursing note reviewed.  Constitutional:      General: He is not in acute distress.    Appearance: Normal appearance.  Cardiovascular:     Rate and Rhythm: Normal rate and regular rhythm.     Heart sounds: Normal heart sounds.  Pulmonary:     Effort: Pulmonary effort is normal.     Breath sounds: Normal breath sounds.  Skin:    General: Skin is warm and dry.  Neurological:     General: No focal deficit present.     Mental Status: He is alert. Mental status is at baseline.  Psychiatric:        Mood and Affect: Mood normal.        Behavior: Behavior normal.        Thought Content: Thought  content normal.        Judgment: Judgment normal.     Results for orders placed or performed in visit on 01/17/24  POCT HgB A1C  Result Value Ref Range   Hemoglobin A1C 6.5 (A) 4.0 - 5.6 %   HbA1c POC (<> result, manual entry)     HbA1c, POC (prediabetic range)     HbA1c, POC (controlled diabetic range)      Last hemoglobin A1c Lab Results  Component Value Date   HGBA1C 6.5 (A) 01/17/2024      The 10-year ASCVD risk score (Arnett DK, et al., 2019) is: 10.4%    Assessment & Plan:   Problem List Items Addressed This Visit     Type 2 diabetes mellitus with hyperglycemia, without long-term current use of insulin (HCC) - Primary   Taking Mounjaro 12.5 mg and metformin XR 500 mg as prescribed.  Denies chest pain, shortness of breath, vision changes, polydipsia, polyphagia, polyuria. Denies hypoglycemia.  Pertinent lab work: A1C: last POC A1C 07/13/23: 6.5, down from 12.4. Point-of-care A1c today 6.5.  Reports that he is tolerating the [facility] well without nausea or  other reported symptoms. He will follow-up with his PCP in 3 months for medication management for diabetes.  Refill sent today.      Relevant Medications   tirzepatide (MOUNJARO) 12.5 MG/0.5ML Pen   Other Relevant Orders   POCT HgB A1C (Completed)   Acute cough   Lingering cough after recent illness.  Lungs are clear, no fever no chills no shortness of breath.  Does not feel ill.  No indication for chest x-ray today.  Reports that he is using guaifenesin expectorant.  Prednisone 10 mg daily x 5 days.  Continue with supportive therapy.  Explained cough can last longer than the illness.  Explained that this could increase his blood sugar, he does not measure blood sugars at home.  Point-of-care A1c today is 6.5.      Relevant Medications   predniSONE (DELTASONE) 10 MG tablet   Encounter for administration of vaccine   Relevant Orders   Flu vaccine trivalent PF, 6mos and older(Flulaval,Afluria,Fluarix,Fluzone)  (Completed)  Agrees with plan of care discussed.  Questions answered.   Return in about 3 months (around 04/23/2024) for DM with PCP .    Novella Olive, FNP

## 2024-01-17 NOTE — Assessment & Plan Note (Addendum)
 Lingering cough after recent illness.  Lungs are clear, no fever no chills no shortness of breath.  Does not feel ill.  No indication for chest x-ray today.  Reports that he is using guaifenesin expectorant.  Prednisone 10 mg daily x 5 days.  Continue with supportive therapy.  Explained cough can last longer than the illness.  Explained that this could increase his blood sugar, he does not measure blood sugars at home.  Point-of-care A1c today is 6.5.

## 2024-01-17 NOTE — Assessment & Plan Note (Signed)
 Taking Mounjaro 12.5 mg and metformin XR 500 mg as prescribed.  Denies chest pain, shortness of breath, vision changes, polydipsia, polyphagia, polyuria. Denies hypoglycemia.  Pertinent lab work: A1C: last POC A1C 07/13/23: 6.5, down from 12.4. Point-of-care A1c today 6.5.  Reports that he is tolerating the [facility] well without nausea or other reported symptoms. He will follow-up with his PCP in 3 months for medication management for diabetes.  Refill sent today.

## 2024-03-29 ENCOUNTER — Encounter: Payer: Self-pay | Admitting: Family Medicine

## 2024-03-29 ENCOUNTER — Telehealth: Payer: Self-pay | Admitting: Family Medicine

## 2024-03-29 NOTE — Telephone Encounter (Signed)
 Copied from CRM [id number]. Topic: Appointments - Scheduling Inquiry for Clinic >> Mar 29, 2024  2:35 PM Arlie Benedict B wrote: Reason for CRM: [id number] patient called in stating that he just received a message from the providers office in regards to her leaving. Patient stated that he would like to become a patient of Dr. Elva Hamburger. Called call was advised to send over a CRM because they would have to approve it with Dr. Elva Hamburger before scheduling.

## 2024-03-29 NOTE — Telephone Encounter (Signed)
 Unfortunately I am no longer taking primary care patients.  He may consider Corita Diego, PA-C when she starts in June.

## 2024-04-25 ENCOUNTER — Ambulatory Visit: Payer: 59 | Admitting: Family Medicine

## 2024-07-03 ENCOUNTER — Encounter: Payer: Self-pay | Admitting: Urgent Care

## 2024-07-03 ENCOUNTER — Ambulatory Visit (INDEPENDENT_AMBULATORY_CARE_PROVIDER_SITE_OTHER): Admitting: Urgent Care

## 2024-07-03 VITALS — BP 124/74 | HR 68 | Temp 98.2°F | Ht 69.0 in | Wt 176.0 lb

## 2024-07-03 DIAGNOSIS — E785 Hyperlipidemia, unspecified: Secondary | ICD-10-CM

## 2024-07-03 DIAGNOSIS — E1165 Type 2 diabetes mellitus with hyperglycemia: Secondary | ICD-10-CM | POA: Diagnosis not present

## 2024-07-03 DIAGNOSIS — R809 Proteinuria, unspecified: Secondary | ICD-10-CM | POA: Diagnosis not present

## 2024-07-03 DIAGNOSIS — Z7985 Long-term (current) use of injectable non-insulin antidiabetic drugs: Secondary | ICD-10-CM

## 2024-07-03 DIAGNOSIS — F5101 Primary insomnia: Secondary | ICD-10-CM | POA: Diagnosis not present

## 2024-07-03 DIAGNOSIS — Z125 Encounter for screening for malignant neoplasm of prostate: Secondary | ICD-10-CM

## 2024-07-03 DIAGNOSIS — Z8042 Family history of malignant neoplasm of prostate: Secondary | ICD-10-CM

## 2024-07-03 LAB — POCT UA - MICROALBUMIN
Creatinine, POC: 300 mg/dL
Microalbumin Ur, POC: 80 mg/L

## 2024-07-03 LAB — POCT GLYCOSYLATED HEMOGLOBIN (HGB A1C): Hemoglobin A1C: 6.7 % — AB (ref 4.0–5.6)

## 2024-07-03 MED ORDER — LISINOPRIL 2.5 MG PO TABS: 2.5000 mg | ORAL_TABLET | Freq: Every day | ORAL | 1 refills | Status: DC

## 2024-07-03 MED ORDER — DOXEPIN HCL 6 MG PO TABS: 1.0000 | ORAL_TABLET | Freq: Every evening | ORAL | 0 refills | Status: DC

## 2024-07-03 MED ORDER — MOUNJARO 15 MG/0.5ML ~~LOC~~ SOAJ: 15.0000 mg | SUBCUTANEOUS | 11 refills | Status: AC

## 2024-07-03 NOTE — Progress Notes (Signed)
 Established Patient Office Visit  Subjective:  Patient ID: Cristian Watkins, male    DOB: [birth year]  Age: 63 y.o. MRN: [id number]  Chief Complaint  Patient presents with   Medical Management of Chronic Issues    HPI  Discussed the use of AI scribe software for clinical note transcription with the patient, who gave verbal consent to proceed.  History of Present Illness   Cristian Watkins is a 63 year old male with type 2 diabetes who presents for management of elevated blood sugar levels.  He manages type 2 diabetes with metformin  XR 1000 mg twice daily and tirzepatide  12.5 mg weekly, without concerns or side effects from tirzepatide . Despite this, his blood sugar levels have increased. He reports eating sugar and candy. He has a habit of eating ice cream after dinner and has tried lower sugar options like [employer] ice creams. He admits portion sizes are the problem as he will often eat directly out of the tub.  He has a history of hyperlipidemia and was previously on atorvastatin  80 mg, but has not taken it for a long time and does not recall the reason for discontinuation. Cholesterol levels have not been checked since November 2023. He ate coco puffs with milk for breakfast this morning.  He experiences occasional feelings of urgency but no other urinary symptoms. His last prostate screening was in 2023.  He reports significant sleep disturbances, describing himself as 'the worst sleeper on the planet.' Previously prescribed trazodone , which was ineffective unless he took three or four tablets, leading to discontinuation. He has also tried Ambien  in the past. Typically gets four to five hours of sleep per night, waking up once or twice without needing to urinate. Falls asleep quickly but struggles to stay asleep. 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       Patient Active Problem List   Diagnosis Date Noted   Acute cough 01/17/2024   Encounter for administration of vaccine 01/17/2024   Overweight (BMI 25.0-29.9) 04/12/2023   HLD (hyperlipidemia) 01/12/2023   DKA, type 2 (HCC) 08/03/2018   Type 2 diabetes mellitus without complications (HCC) 08/03/2018   HTN (hypertension) 07/17/2018   Steatohepatitis 12/15/2016   Kidney cyst, acquired 12/07/2016   Shoulder separation, right, initial encounter 09/15/2016   Glenohumeral arthritis, right 09/15/2016   Elevated C-reactive protein (CRP) 08/13/2016   Lateral epicondylitis of both elbows 01/02/2016   Patellofemoral arthralgia of both knees 10/31/2015   Family history of prostate cancer 08/24/2013   Insomnia 08/24/2013   Type 2 diabetes mellitus with hyperglycemia, without long-term current use of insulin (HCC) 08/24/2013   History of sleep apnea 08/24/2013   Past Medical History:  Diagnosis Date   Diabetes [facility])    Past Surgical History:  Procedure Laterality Date   rotator cuff surgeries  1999,2010   Social History   Tobacco Use   Smoking status: Former    Types: Cigarettes   Smokeless tobacco: Current    Types: Chew  Substance Use Topics   Alcohol use: Yes    Comment: 4 per week   Drug use: Never      ROS: as noted in HPI  Objective:     BP 124/74 (BP Location: Left Arm, Patient Position: Sitting, Cuff Size: Normal)   Pulse 68   Temp 98.2 F (36.8 C) (Oral)  Ht 5' 9 (1.753 m)   Wt 176 lb (79.8 kg)   BMI 25.99 kg/m  BP Readings from Last 3 Encounters:  07/03/24 124/74  01/17/24 110/65  07/13/23 118/71   Wt Readings from Last 3 Encounters:  07/03/24 176 lb (79.8 kg)  01/17/24 173 lb 6.4 oz (78.7 kg)  07/13/23 174 lb (78.9 kg)      Physical Exam Vitals and nursing note reviewed.  Constitutional:      General: He is not in acute distress.    Appearance: Normal appearance. He is not ill-appearing, toxic-appearing or  diaphoretic.  HENT:     Head: Normocephalic and atraumatic.     Right Ear: Tympanic membrane, ear canal and external ear normal. There is no impacted cerumen.     Left Ear: Tympanic membrane, ear canal and external ear normal. There is no impacted cerumen.     Nose: Nose normal.     Mouth/Throat:     Mouth: Mucous membranes are moist.     Pharynx: Oropharynx is clear. No oropharyngeal exudate or posterior oropharyngeal erythema.  Eyes:     General: No scleral icterus.       Right eye: No discharge.        Left eye: No discharge.     Extraocular Movements: Extraocular movements intact.     Pupils: Pupils are equal, round, and reactive to light.  Neck:     Thyroid: No thyroid mass, thyromegaly or thyroid tenderness.  Cardiovascular:     Rate and Rhythm: Normal rate and regular rhythm.     Pulses: Normal pulses.     Heart sounds: No murmur heard. Pulmonary:     Effort: Pulmonary effort is normal. No respiratory distress.     Breath sounds: Normal breath sounds. No stridor. No wheezing or rhonchi.  Musculoskeletal:     Cervical back: Normal range of motion and neck supple. No rigidity or tenderness.     Right lower leg: No edema.     Left lower leg: No edema.  Feet:     Right foot:     Protective Sensation: 10 sites tested.  10 sites sensed.     Skin integrity: Skin integrity normal.     Left foot:     Protective Sensation: 10 sites tested.  10 sites sensed.     Skin integrity: Skin integrity normal.  Lymphadenopathy:     Cervical: No cervical adenopathy.  Skin:    General: Skin is warm and dry.     Coloration: Skin is not jaundiced.     Findings: No bruising, erythema or rash.  Neurological:     General: No focal deficit present.     Mental Status: He is alert and oriented to person, place, and time.     Sensory: No sensory deficit.     Motor: No weakness.  Psychiatric:        Mood and Affect: Mood normal.        Behavior: Behavior normal.      Results for orders  placed or performed in visit on 07/03/24  POCT HgB A1C  Result Value Ref Range   Hemoglobin A1C 6.7 (A) 4.0 - 5.6 %   HbA1c POC (<> result, manual entry)     HbA1c, POC (prediabetic range)     HbA1c, POC (controlled diabetic range)    POCT UA - Microalbumin  Result Value Ref Range   Microalbumin Ur, POC 80 mg/L   Creatinine, POC 300 mg/dL   Albumin/Creatinine Ratio,  Urine, POC 30-300      Last CBC Lab Results  Component Value Date   WBC 4.3 10/11/2022   HGB 15.7 10/11/2022   HCT 47.6 10/11/2022   MCV 84.8 10/11/2022   MCH 28.0 10/11/2022   RDW 13.1 10/11/2022   PLT 208 10/11/2022   Last metabolic panel Lab Results  Component Value Date   GLUCOSE 224 (H) 04/12/2023   NA 137 04/12/2023   K 4.0 04/12/2023   CL 100 04/12/2023   CO2 28 04/12/2023   BUN 11 04/12/2023   CREATININE 0.75 04/12/2023   EGFR 102 04/12/2023   CALCIUM  9.3 04/12/2023   PROT 6.6 04/12/2023   ALBUMIN 4.3 06/06/2017   BILITOT 0.6 04/12/2023   ALKPHOS 50 06/06/2017   AST 12 04/12/2023   ALT 27 04/12/2023   Last lipids Lab Results  Component Value Date   CHOL 134 10/11/2022   HDL 60 10/11/2022   LDLCALC 60 10/11/2022   TRIG 68 10/11/2022   CHOLHDL 2.2 10/11/2022   Last hemoglobin A1c Lab Results  Component Value Date   HGBA1C 6.7 (A) 07/03/2024   Last thyroid functions Lab Results  Component Value Date   TSH 1.02 08/12/2016   Last vitamin D  Lab Results  Component Value Date   VD25OH 30 08/12/2016   Last vitamin B12 and Folate No results found for: VITAMINB12, FOLATE    The 10-year ASCVD risk score (Arnett DK, et al., 2019) is: 16.1%  Assessment & Plan:  Type 2 diabetes mellitus with hyperglycemia, without long-term current use of insulin (HCC) -     POCT glycosylated hemoglobin (Hb A1C) -     POCT UA - Microalbumin -     CBC with Differential/Platelet -     TSH -     Lipid panel -     Comprehensive metabolic panel with GFR -     Mounjaro ; Inject 15 mg into the skin  once a week.  Dispense: 2 mL; Refill: 11 -     Lisinopril ; Take 1 tablet (2.5 mg total) by mouth daily.  Dispense: 90 tablet; Refill: 1  Primary insomnia -     Doxepin  HCl; Take 1 tablet (6 mg total) by mouth at bedtime.  Dispense: 30 tablet; Refill: 0 -     Comprehensive metabolic panel with GFR  Hyperlipidemia, unspecified hyperlipidemia type -     Lipid panel  Prostate cancer screening -     PSA  Family history of prostate cancer -     PSA  Microalbuminuria -     Lisinopril ; Take 1 tablet (2.5 mg total) by mouth daily.  Dispense: 90 tablet; Refill: 1  Assessment and Plan    Type 2 diabetes mellitus Recent increase in blood glucose likely due to high sugar intake. Current medications include metformin  XR and tirzepatide . Discussed maintaining HbA1c at 6.5 or lower. - Increase tirzepatide  to 15 mg weekly. - Encourage portion control and lower sugar ice cream options. - Discuss strategies to reduce sugar intake.  Hyperlipidemia Atorvastatin  80 mg previously prescribed but not taken. Discussed statins' importance in preventing cardiovascular and renal complications in diabetics. - Order lipid panel today. - Reassess atorvastatin  use based on lipid panel results.  Insomnia, sleep maintenance type Chronic insomnia likely due to early waking habits. Trazodone  ineffective. Discussed doxepin  for sleep maintenance. - Prescribe doxepin  6 mg, 30 minutes before sleep. - Consider increasing to 10 mg if 6 mg is ineffective.  Fam hx prostate CA Occasional urgency, no significant urinary symptoms.  Last prostate screening in 2023. - Order PSA lab test for prostate screening.  General Health Maintenance Discussed routine health maintenance and ACE inhibitors for kidney protection in diabetics. - Ordered microalbumin test to screen for proteinuria, positive - Start lisinopril  2.5 mg daily - needs diabetic retinopathy screening - will RTC in 4 months for annual PE in which pneumonia  vaccine will be updated        Return in about 4 months (around 11/02/2024) for Annual Physical.   Benton LITTIE Gave, PA

## 2024-07-03 NOTE — Patient Instructions (Addendum)
 Here is some information on DM diet guidelines: Portion sizes matter!! Eat on a smaller plate (salad plate) to help control portions. Eyeball it!! Look at the below guide to help prevent over-eating.     Please start taking doxepin  6mg  nightly. Take prior to sleep. This should help you stay asleep. If this does not work, let me know and we can increase to 10mg .  Increase your mounjaro  to 15mg  weekly.  Your microalbumin (urine protein test) was positive. Therefore, lets add a medication to help prevent worsening protein. Please take this every morning.  Please return in 4 months for annual physical

## 2024-07-04 ENCOUNTER — Ambulatory Visit: Payer: Self-pay | Admitting: Urgent Care

## 2024-07-04 LAB — CBC WITH DIFFERENTIAL/PLATELET
Basophils Absolute: 0 x10E3/uL (ref 0.0–0.2)
Basos: 0 %
EOS (ABSOLUTE): 0.1 x10E3/uL (ref 0.0–0.4)
Eos: 2 %
Hematocrit: 44.1 % (ref 37.5–51.0)
Hemoglobin: 14.1 g/dL (ref 13.0–17.7)
Immature Grans (Abs): 0 x10E3/uL (ref 0.0–0.1)
Immature Granulocytes: 0 %
Lymphocytes Absolute: 1.3 x10E3/uL (ref 0.7–3.1)
Lymphs: 23 %
MCH: 27.6 pg (ref 26.6–33.0)
MCHC: 32 g/dL (ref 31.5–35.7)
MCV: 86 fL (ref 79–97)
Monocytes Absolute: 0.3 x10E3/uL (ref 0.1–0.9)
Monocytes: 6 %
Neutrophils Absolute: 3.8 x10E3/uL (ref 1.4–7.0)
Neutrophils: 69 %
Platelets: 217 x10E3/uL (ref 150–450)
RBC: 5.11 x10E6/uL (ref 4.14–5.80)
RDW: 14.1 % (ref 11.6–15.4)
WBC: 5.5 x10E3/uL (ref 3.4–10.8)

## 2024-07-04 LAB — COMPREHENSIVE METABOLIC PANEL WITH GFR
ALT: 20 IU/L (ref 0–44)
AST: 20 IU/L (ref 0–40)
Albumin: 4.4 g/dL (ref 3.9–4.9)
Alkaline Phosphatase: 67 IU/L (ref 44–121)
BUN/Creatinine Ratio: 15 (ref 10–24)
BUN: 12 mg/dL (ref 8–27)
Bilirubin Total: 0.6 mg/dL (ref 0.0–1.2)
CO2: 21 mmol/L (ref 20–29)
Calcium: 9.1 mg/dL (ref 8.6–10.2)
Chloride: 100 mmol/L (ref 96–106)
Creatinine, Ser: 0.81 mg/dL (ref 0.76–1.27)
Globulin, Total: 2.2 g/dL (ref 1.5–4.5)
Glucose: 221 mg/dL — ABNORMAL HIGH (ref 70–99)
Potassium: 4.1 mmol/L (ref 3.5–5.2)
Sodium: 136 mmol/L (ref 134–144)
Total Protein: 6.6 g/dL (ref 6.0–8.5)
eGFR: 98 mL/min/1.73 (ref >59)

## 2024-07-04 LAB — TSH: TSH: 0.796 u[IU]/mL (ref 0.450–4.500)

## 2024-07-04 LAB — LIPID PANEL
Chol/HDL Ratio: 2 ratio (ref 0.0–5.0)
Cholesterol, Total: 99 mg/dL — ABNORMAL LOW (ref 100–199)
HDL: 50 mg/dL (ref >39)
LDL Chol Calc (NIH): 37 mg/dL (ref 0–99)
Triglycerides: 45 mg/dL (ref 0–149)
VLDL Cholesterol Cal: 12 mg/dL (ref 5–40)

## 2024-07-04 LAB — PSA: Prostate Specific Ag, Serum: 3.3 ng/mL (ref 0.0–4.0)

## 2024-07-05 ENCOUNTER — Telehealth: Payer: Self-pay

## 2024-07-05 ENCOUNTER — Other Ambulatory Visit (HOSPITAL_COMMUNITY): Payer: Self-pay

## 2024-07-05 NOTE — Telephone Encounter (Signed)
 Pharmacy Patient Advocate Encounter  Received notification from [employer] that Prior Authorization for Doxepin  6mg  tabs has been APPROVED from 06/05/24 to 07/05/25   PA #/Case ID/Reference #: [id number]

## 2024-07-05 NOTE — Telephone Encounter (Signed)
 Pharmacy Patient Advocate Encounter   Received notification from CoverMyMeds that prior authorization for Doxepin  6mg  tabs is required/requested.   Insurance verification completed.   The patient is insured through [employer] .   Per test claim: PA required; PA submitted to above mentioned insurance via Latent Key/confirmation #/EOC [facility] Status is pending

## 2024-07-05 NOTE — Telephone Encounter (Signed)
 Copied from CRM [id number]. Topic: Medical Record Request - Other >> Jul 05, 2024  9:56 AM Diannia H wrote: Reason for CRM: Patient came in on 08/12 and provider stated she would get his records from his eye doctor but he believes she got the wrong clinic. His eye exam and the correct clinic is [facility] [address], [place], [place] [address] Hours Open  Closes 5:30 PM Phone [phone]

## 2024-07-06 NOTE — Telephone Encounter (Signed)
 Records requested from the location listed below today. Annabella Rigg, CMA

## 2024-09-26 ENCOUNTER — Telehealth: Payer: Self-pay

## 2024-09-26 ENCOUNTER — Other Ambulatory Visit (HOSPITAL_COMMUNITY): Payer: Self-pay

## 2024-09-26 NOTE — Telephone Encounter (Signed)
 Pharmacy Patient Advocate Encounter   Received notification from Pt Calls Messages that prior authorization for Mounjaro  15mg /0.48ml is required/requested.   Insurance verification completed.   The patient is insured through [employer].   Per test claim: Refill too soon. PA is not needed at this time. Medication was filled 09/21/24. Next eligible fill date is 10/18/24.

## 2024-09-26 NOTE — Telephone Encounter (Signed)
 Needs a prior authorization. Refills provided for a year in August. See PA encounter.

## 2024-09-26 NOTE — Telephone Encounter (Signed)
 Patient came into office to see about his Mounjaro , patient needs 3 month supply, please advise, thanks.

## 2024-10-04 ENCOUNTER — Other Ambulatory Visit: Payer: Self-pay

## 2024-10-04 DIAGNOSIS — E1165 Type 2 diabetes mellitus with hyperglycemia: Secondary | ICD-10-CM

## 2024-10-04 DIAGNOSIS — R809 Proteinuria, unspecified: Secondary | ICD-10-CM

## 2024-10-04 MED ORDER — LISINOPRIL 2.5 MG PO TABS: 2.5000 mg | ORAL_TABLET | Freq: Every day | ORAL | 1 refills | Status: AC

## 2024-10-08 ENCOUNTER — Other Ambulatory Visit: Payer: Self-pay | Admitting: Urgent Care

## 2024-10-08 DIAGNOSIS — E785 Hyperlipidemia, unspecified: Secondary | ICD-10-CM

## 2024-10-08 MED ORDER — ATORVASTATIN CALCIUM 40 MG PO TABS: 40.0000 mg | ORAL_TABLET | Freq: Every evening | ORAL | 3 refills | Status: AC

## 2024-10-08 NOTE — Progress Notes (Signed)
 Received Rx request from [facility] for statin refill. Last lipid panel showed LDL of 37 and total cholesterol of 99. Due to this, I have cut it back to 40mg  nightly in place of the previously prescribed 80mg .

## 2024-11-02 ENCOUNTER — Ambulatory Visit: Admitting: Urgent Care

## 2024-11-02 ENCOUNTER — Encounter: Payer: Self-pay | Admitting: Urgent Care

## 2024-11-02 VITALS — BP 114/69 | HR 76 | Ht 69.0 in | Wt 168.0 lb

## 2024-11-02 DIAGNOSIS — Z7984 Long term (current) use of oral hypoglycemic drugs: Secondary | ICD-10-CM

## 2024-11-02 DIAGNOSIS — E1165 Type 2 diabetes mellitus with hyperglycemia: Secondary | ICD-10-CM | POA: Diagnosis not present

## 2024-11-02 DIAGNOSIS — F458 Other somatoform disorders: Secondary | ICD-10-CM

## 2024-11-02 DIAGNOSIS — Z23 Encounter for immunization: Secondary | ICD-10-CM

## 2024-11-02 DIAGNOSIS — Z7985 Long-term (current) use of injectable non-insulin antidiabetic drugs: Secondary | ICD-10-CM

## 2024-11-02 DIAGNOSIS — Z0000 Encounter for general adult medical examination without abnormal findings: Secondary | ICD-10-CM | POA: Diagnosis not present

## 2024-11-02 DIAGNOSIS — F5101 Primary insomnia: Secondary | ICD-10-CM | POA: Diagnosis not present

## 2024-11-02 DIAGNOSIS — E785 Hyperlipidemia, unspecified: Secondary | ICD-10-CM | POA: Diagnosis not present

## 2024-11-02 LAB — POCT GLYCOSYLATED HEMOGLOBIN (HGB A1C): Hemoglobin A1C: 6.3 % — AB (ref 4.0–5.6)

## 2024-11-02 MED ORDER — DOXEPIN HCL 25 MG PO CAPS: 25.0000 mg | ORAL_CAPSULE | Freq: Every day | ORAL | 3 refills | Status: AC

## 2024-11-02 MED ORDER — METFORMIN HCL ER 500 MG PO TB24: 1000.0000 mg | ORAL_TABLET | Freq: Two times a day (BID) | ORAL | 3 refills | Status: AC

## 2024-11-02 NOTE — Patient Instructions (Addendum)
 Fax number: [phone]  Please have your eye records faxed to the number above.  Continue all medications as ordered for now. We may decrease your atorvastatin  pending your lipid panel today.  Great work on the blood sugar and weight loss! :)  Start 25mg  doxepin  prior to bed.  Return to see me in 4 months.  We updated your flu and pneumonia vaccine. Please return in 1 month for your tetanus and covid vaccine.

## 2024-11-02 NOTE — Progress Notes (Unsigned)
 Annual Wellness Visit     Patient: Cristian Watkins, Male    DOB: [birth year], 63 y.o.   MRN: [id number]  Subjective  Chief Complaint  Patient presents with   Annual Exam    MARK HASSEY is a 63 y.o. male who presents today for his Annual Wellness Visit. He reports consuming a general diet. Walking several miles intermittently thorughout the week. He generally feels well. He reports sleeping fairly well. He {does/does not:200015} have additional problems to discuss today.   HPI  Vision:Not within last year  and Dental: Current dental problems and No regular dental care   C/o grinding teeth  Patient Active Problem List   Diagnosis Date Noted   Acute cough 01/17/2024   Encounter for administration of vaccine 01/17/2024   Overweight (BMI 25.0-29.9) 04/12/2023   HLD (hyperlipidemia) 01/12/2023   DKA, type 2 (HCC) 08/03/2018   Type 2 diabetes mellitus without complications (HCC) 08/03/2018   HTN (hypertension) 07/17/2018   Steatohepatitis 12/15/2016   Kidney cyst, acquired 12/07/2016   Shoulder separation, right, initial encounter 09/15/2016   Glenohumeral arthritis, right 09/15/2016   Elevated C-reactive protein (CRP) 08/13/2016   Lateral epicondylitis of both elbows 01/02/2016   Patellofemoral arthralgia of both knees 10/31/2015   Family history of prostate cancer 08/24/2013   Insomnia 08/24/2013   Type 2 diabetes mellitus with hyperglycemia, without long-term current use of insulin (HCC) 08/24/2013   History of sleep apnea 08/24/2013   Past Medical History:  Diagnosis Date   Diabetes [facility])    Past Surgical History:  Procedure Laterality Date   rotator cuff surgeries  1999,2010   Social History[1]    Medications: Show/hide medication list[2]  Allergies[3]  Patient Care Team: Lowella Benton CROME, [place] as PCP - General (Physician Assistant)  ROS      Objective  BP 114/69   Pulse 76   Ht 5' 9 (1.753 m)   Wt 168 lb (76.2 kg)   SpO2 99%   BMI 24.81 kg/m   BP Readings from Last 3 Encounters:  11/02/24 114/69  07/03/24 124/74  01/17/24 110/65   Wt Readings from Last 3 Encounters:  11/02/24 168 lb (76.2 kg)  07/03/24 176 lb (79.8 kg)  01/17/24 173 lb 6.4 oz (78.7 kg)      Physical Exam  Most recent depression screenings:    04/12/2023    8:18 AM 01/12/2023    2:32 PM  PHQ 2/9 Scores  PHQ - 2 Score 0 0    No data recorded  Vision/Hearing Screen: No results found.  Last CBC Lab Results  Component Value Date   WBC 5.5 07/03/2024   HGB 14.1 07/03/2024   HCT 44.1 07/03/2024   MCV 86 07/03/2024   MCH 27.6 07/03/2024   RDW 14.1 07/03/2024   PLT 217 07/03/2024   Last metabolic panel Lab Results  Component Value Date   GLUCOSE 221 (H) 07/03/2024   NA 136 07/03/2024   K 4.1 07/03/2024   CL 100 07/03/2024   CO2 21 07/03/2024   BUN 12 07/03/2024   CREATININE 0.81 07/03/2024   EGFR 98 07/03/2024   CALCIUM  9.1 07/03/2024   PROT 6.6 07/03/2024   ALBUMIN 4.4 07/03/2024   LABGLOB 2.2 07/03/2024   BILITOT 0.6 07/03/2024   ALKPHOS 67 07/03/2024   AST 20 07/03/2024   ALT 20 07/03/2024   Last lipids Lab Results  Component Value Date   CHOL 99 (L) 07/03/2024   HDL 50 07/03/2024   LDLCALC 37 07/03/2024  TRIG 45 07/03/2024   CHOLHDL 2.0 07/03/2024   Last hemoglobin A1c Lab Results  Component Value Date   HGBA1C 6.7 (A) 07/03/2024   Last thyroid functions Lab Results  Component Value Date   TSH 0.796 07/03/2024   Last vitamin D  Lab Results  Component Value Date   VD25OH 30 08/12/2016   Last vitamin B12 and Folate No results found for: VITAMINB12, FOLATE    No results found for any visits on 11/02/24.    Assessment & Plan   Annual wellness visit done today including the all of the following: Reviewed patient's Family and Medical History Reviewed and updated list of patient's medical providers Assessment of cognitive impairment was done Assessed patient's functional ability Established a written  schedule for health screening services Health Risk Assessent Completed and Reviewed  Exercise Activities and Dietary recommendations  Goals   None     Immunization History  Administered Date(s) Administered   DTaP 07/06/2009   Influenza, Seasonal, Injecte, Preservative Fre 01/17/2024   Influenza,inj,Quad PF,6+ Mos 08/24/2013, 10/25/2014, 10/31/2015, 08/03/2016, 09/20/2017, 07/17/2018, 09/05/2019, 01/12/2023   Janssen (J&J) SARS-COV-2 Vaccination 03/01/2020   Moderna Sars-Covid-2 Vaccination 11/21/2020   Pneumococcal Conjugate-13 08/03/2016   Pneumococcal Polysaccharide-23 10/08/2016   Tdap 07/09/2014   Zoster Recombinant(Shingrix) 12/19/2019, 02/18/2020    Health Maintenance  Topic Date Due   Pneumococcal Vaccine: 50+ Years (2 of 2 - PCV20 or PCV21) 10/08/2021   OPHTHALMOLOGY EXAM  11/19/2023   Influenza Vaccine  06/22/2024   DTaP/Tdap/Td (3 - Td or Tdap) 07/09/2024   COVID-19 Vaccine (3 - 2025-26 season) 07/23/2024   HEMOGLOBIN A1C  01/03/2025   Diabetic kidney evaluation - eGFR measurement  07/03/2025   Diabetic kidney evaluation - Urine ACR  07/03/2025   FOOT EXAM  07/03/2025   Colonoscopy  09/15/2030   Hepatitis C Screening  Completed   HIV Screening  Completed   Zoster Vaccines- Shingrix  Completed   Hepatitis B Vaccines 19-59 Average Risk  Aged Out   HPV VACCINES  Aged Out   Meningococcal B Vaccine  Aged Out     Discussed health benefits of physical activity, and encouraged him to engage in regular exercise appropriate for his age and condition.    Problem List Items Addressed This Visit       Endocrine   Type 2 diabetes mellitus with hyperglycemia, without long-term current use of insulin (HCC) - Primary   Relevant Orders   POCT HgB A1C    No follow-ups on file.     Benton LITTIE Gave, PA       [1]  Social History Tobacco Use   Smoking status: Former    Types: Cigarettes   Smokeless tobacco: Current    Types: Chew  Substance Use Topics    Alcohol use: Yes    Comment: 4 per week   Drug use: Never  [2]  Outpatient Medications Prior to Visit  Medication Sig   atorvastatin  (LIPITOR) 40 MG tablet Take 1 tablet (40 mg total) by mouth at bedtime.   Doxepin  HCl 6 MG TABS Take 1 tablet (6 mg total) by mouth at bedtime.   lisinopril  (ZESTRIL ) 2.5 MG tablet Take 1 tablet (2.5 mg total) by mouth daily.   metFORMIN  (GLUCOPHAGE -XR) 500 MG 24 hr tablet Take 2 tablets (1,000 mg total) by mouth 2 (two) times daily.   tirzepatide  (MOUNJARO ) 15 MG/0.5ML Pen Inject 15 mg into the skin once a week.   No facility-administered medications prior to visit.  [3]  Allergies Allergen  Reactions   Canagliflozin  Other (See Comments)    DKA

## 2024-11-03 ENCOUNTER — Ambulatory Visit: Payer: Self-pay | Admitting: Urgent Care

## 2024-11-03 LAB — CMP14+EGFR
ALT: 22 IU/L (ref 0–44)
AST: 24 IU/L (ref 0–40)
Albumin: 4.6 g/dL (ref 3.9–4.9)
Alkaline Phosphatase: 57 IU/L (ref 47–123)
BUN/Creatinine Ratio: 14 (ref 10–24)
BUN: 10 mg/dL (ref 8–27)
Bilirubin Total: 0.5 mg/dL (ref 0.0–1.2)
CO2: 23 mmol/L (ref 20–29)
Calcium: 9.4 mg/dL (ref 8.6–10.2)
Chloride: 103 mmol/L (ref 96–106)
Creatinine, Ser: 0.72 mg/dL — ABNORMAL LOW (ref 0.76–1.27)
Globulin, Total: 2.2 g/dL (ref 1.5–4.5)
Glucose: 107 mg/dL — ABNORMAL HIGH (ref 70–99)
Potassium: 4.6 mmol/L (ref 3.5–5.2)
Sodium: 141 mmol/L (ref 134–144)
Total Protein: 6.8 g/dL (ref 6.0–8.5)
eGFR: 102 mL/min/1.73 (ref >59)

## 2024-11-03 LAB — LIPID PANEL
Chol/HDL Ratio: 1.9 ratio (ref 0.0–5.0)
Cholesterol, Total: 102 mg/dL (ref 100–199)
HDL: 53 mg/dL (ref >39)
LDL Chol Calc (NIH): 38 mg/dL (ref 0–99)
Triglycerides: 41 mg/dL (ref 0–149)
VLDL Cholesterol Cal: 11 mg/dL (ref 5–40)

## 2024-12-04 ENCOUNTER — Ambulatory Visit (INDEPENDENT_AMBULATORY_CARE_PROVIDER_SITE_OTHER)

## 2024-12-04 VITALS — Temp 98.2°F

## 2024-12-04 DIAGNOSIS — Z23 Encounter for immunization: Secondary | ICD-10-CM | POA: Diagnosis not present

## 2024-12-28 ENCOUNTER — Other Ambulatory Visit (HOSPITAL_COMMUNITY): Payer: Self-pay

## 2024-12-28 ENCOUNTER — Telehealth: Payer: Self-pay

## 2024-12-28 NOTE — Telephone Encounter (Signed)
 Pharmacy Patient Advocate Encounter   Received notification from Physician's Office that prior authorization for MOUNJARO  is required/requested.   Insurance verification completed.   The patient is insured through [employer].   Per test claim: PA required; PA submitted to above mentioned insurance via Latent Key/confirmation #/EOC [id number] Status is pending

## 2024-12-28 NOTE — Telephone Encounter (Signed)
 Patient stopped by the office and states the mounjaro  is in need of PA

## 2024-12-28 NOTE — Telephone Encounter (Signed)
 Opened in Error.

## 2025-03-05 ENCOUNTER — Ambulatory Visit: Admitting: Urgent Care
